# Patient Record
Sex: Female | Born: 1955 | Race: White | Hispanic: No | Smoking: Never smoker
Health system: Southern US, Community
[De-identification: ages and names within clinical notes are randomized; demographics above are authoritative.]

## PROBLEM LIST (undated history)

## (undated) DIAGNOSIS — T8859XA Other complications of anesthesia, initial encounter: Secondary | ICD-10-CM

## (undated) DIAGNOSIS — F419 Anxiety disorder, unspecified: Secondary | ICD-10-CM

## (undated) DIAGNOSIS — T4145XA Adverse effect of unspecified anesthetic, initial encounter: Secondary | ICD-10-CM

## (undated) DIAGNOSIS — F32A Depression, unspecified: Secondary | ICD-10-CM

## (undated) DIAGNOSIS — I341 Nonrheumatic mitral (valve) prolapse: Secondary | ICD-10-CM

## (undated) DIAGNOSIS — K759 Inflammatory liver disease, unspecified: Secondary | ICD-10-CM

## (undated) DIAGNOSIS — R112 Nausea with vomiting, unspecified: Secondary | ICD-10-CM

## (undated) DIAGNOSIS — Z9889 Other specified postprocedural states: Secondary | ICD-10-CM

## (undated) DIAGNOSIS — F1120 Opioid dependence, uncomplicated: Secondary | ICD-10-CM

## (undated) DIAGNOSIS — F329 Major depressive disorder, single episode, unspecified: Secondary | ICD-10-CM

## (undated) HISTORY — PX: ABDOMINAL HYSTERECTOMY: SHX81

## (undated) HISTORY — PX: DENTAL SURGERY: SHX609

## (undated) HISTORY — PX: SPLENECTOMY: SUR1306

## (undated) HISTORY — PX: CHOLECYSTECTOMY: SHX55

---

## 1998-08-18 ENCOUNTER — Emergency Department (HOSPITAL_COMMUNITY): Admission: EM | Admit: 1998-08-18 | Discharge: 1998-08-18 | Payer: Self-pay | Admitting: Emergency Medicine

## 1998-08-20 ENCOUNTER — Emergency Department (HOSPITAL_COMMUNITY): Admission: EM | Admit: 1998-08-20 | Discharge: 1998-08-20 | Payer: Self-pay

## 1998-08-27 ENCOUNTER — Emergency Department (HOSPITAL_COMMUNITY): Admission: EM | Admit: 1998-08-27 | Discharge: 1998-08-27 | Payer: Self-pay | Admitting: Emergency Medicine

## 1999-03-29 ENCOUNTER — Emergency Department (HOSPITAL_COMMUNITY): Admission: EM | Admit: 1999-03-29 | Discharge: 1999-03-29 | Payer: Self-pay | Admitting: Emergency Medicine

## 1999-08-31 ENCOUNTER — Emergency Department (HOSPITAL_COMMUNITY): Admission: EM | Admit: 1999-08-31 | Discharge: 1999-08-31 | Payer: Self-pay | Admitting: Emergency Medicine

## 1999-09-08 ENCOUNTER — Emergency Department (HOSPITAL_COMMUNITY): Admission: EM | Admit: 1999-09-08 | Discharge: 1999-09-09 | Payer: Self-pay | Admitting: Emergency Medicine

## 1999-09-14 ENCOUNTER — Emergency Department (HOSPITAL_COMMUNITY): Admission: EM | Admit: 1999-09-14 | Discharge: 1999-09-14 | Payer: Self-pay | Admitting: Emergency Medicine

## 1999-10-24 ENCOUNTER — Emergency Department (HOSPITAL_COMMUNITY): Admission: EM | Admit: 1999-10-24 | Discharge: 1999-10-24 | Payer: Self-pay | Admitting: Emergency Medicine

## 1999-11-02 ENCOUNTER — Emergency Department (HOSPITAL_COMMUNITY): Admission: EM | Admit: 1999-11-02 | Discharge: 1999-11-02 | Payer: Self-pay | Admitting: Podiatry

## 1999-11-02 ENCOUNTER — Encounter: Payer: Self-pay | Admitting: *Deleted

## 1999-11-19 ENCOUNTER — Emergency Department (HOSPITAL_COMMUNITY): Admission: EM | Admit: 1999-11-19 | Discharge: 1999-11-20 | Payer: Self-pay | Admitting: *Deleted

## 1999-12-05 ENCOUNTER — Emergency Department (HOSPITAL_COMMUNITY): Admission: EM | Admit: 1999-12-05 | Discharge: 1999-12-05 | Payer: Self-pay | Admitting: Emergency Medicine

## 2000-05-05 ENCOUNTER — Emergency Department (HOSPITAL_COMMUNITY): Admission: EM | Admit: 2000-05-05 | Discharge: 2000-05-06 | Payer: Self-pay | Admitting: Internal Medicine

## 2000-05-29 ENCOUNTER — Emergency Department (HOSPITAL_COMMUNITY): Admission: EM | Admit: 2000-05-29 | Discharge: 2000-05-29 | Payer: Self-pay

## 2000-06-01 ENCOUNTER — Emergency Department (HOSPITAL_COMMUNITY): Admission: EM | Admit: 2000-06-01 | Discharge: 2000-06-01 | Payer: Self-pay | Admitting: Emergency Medicine

## 2000-08-26 ENCOUNTER — Emergency Department (HOSPITAL_COMMUNITY): Admission: EM | Admit: 2000-08-26 | Discharge: 2000-08-26 | Payer: Self-pay | Admitting: Emergency Medicine

## 2000-09-05 ENCOUNTER — Emergency Department (HOSPITAL_COMMUNITY): Admission: EM | Admit: 2000-09-05 | Discharge: 2000-09-05 | Payer: Self-pay | Admitting: Emergency Medicine

## 2000-10-17 ENCOUNTER — Emergency Department (HOSPITAL_COMMUNITY): Admission: EM | Admit: 2000-10-17 | Discharge: 2000-10-17 | Payer: Self-pay

## 2000-10-25 ENCOUNTER — Emergency Department (HOSPITAL_COMMUNITY): Admission: EM | Admit: 2000-10-25 | Discharge: 2000-10-25 | Payer: Self-pay | Admitting: Emergency Medicine

## 2000-11-03 ENCOUNTER — Emergency Department (HOSPITAL_COMMUNITY): Admission: EM | Admit: 2000-11-03 | Discharge: 2000-11-03 | Payer: Self-pay | Admitting: Emergency Medicine

## 2000-12-01 ENCOUNTER — Emergency Department (HOSPITAL_COMMUNITY): Admission: EM | Admit: 2000-12-01 | Discharge: 2000-12-01 | Payer: Self-pay | Admitting: Emergency Medicine

## 2001-09-08 ENCOUNTER — Emergency Department (HOSPITAL_COMMUNITY): Admission: EM | Admit: 2001-09-08 | Discharge: 2001-09-08 | Payer: Self-pay | Admitting: Emergency Medicine

## 2002-03-02 ENCOUNTER — Emergency Department (HOSPITAL_COMMUNITY): Admission: EM | Admit: 2002-03-02 | Discharge: 2002-03-02 | Payer: Self-pay | Admitting: Emergency Medicine

## 2002-03-03 ENCOUNTER — Encounter: Payer: Self-pay | Admitting: Emergency Medicine

## 2002-03-22 ENCOUNTER — Emergency Department (HOSPITAL_COMMUNITY): Admission: EM | Admit: 2002-03-22 | Discharge: 2002-03-22 | Payer: Self-pay

## 2002-04-19 ENCOUNTER — Emergency Department (HOSPITAL_COMMUNITY): Admission: EM | Admit: 2002-04-19 | Discharge: 2002-04-19 | Payer: Self-pay

## 2005-10-08 ENCOUNTER — Emergency Department (HOSPITAL_COMMUNITY): Admission: EM | Admit: 2005-10-08 | Discharge: 2005-10-08 | Payer: Self-pay | Admitting: Emergency Medicine

## 2007-09-24 ENCOUNTER — Emergency Department (HOSPITAL_COMMUNITY): Admission: EM | Admit: 2007-09-24 | Discharge: 2007-09-25 | Payer: Self-pay | Admitting: Emergency Medicine

## 2007-12-27 ENCOUNTER — Emergency Department (HOSPITAL_COMMUNITY): Admission: EM | Admit: 2007-12-27 | Discharge: 2007-12-27 | Payer: Self-pay | Admitting: Emergency Medicine

## 2008-08-28 ENCOUNTER — Emergency Department (HOSPITAL_COMMUNITY): Admission: EM | Admit: 2008-08-28 | Discharge: 2008-08-28 | Payer: Self-pay | Admitting: Emergency Medicine

## 2008-11-03 ENCOUNTER — Inpatient Hospital Stay (HOSPITAL_COMMUNITY): Admission: EM | Admit: 2008-11-03 | Discharge: 2008-11-04 | Payer: Self-pay | Admitting: Emergency Medicine

## 2008-11-03 ENCOUNTER — Encounter (INDEPENDENT_AMBULATORY_CARE_PROVIDER_SITE_OTHER): Payer: Self-pay | Admitting: Surgery

## 2009-08-21 ENCOUNTER — Emergency Department (HOSPITAL_COMMUNITY): Admission: EM | Admit: 2009-08-21 | Discharge: 2009-08-21 | Payer: Self-pay | Admitting: Emergency Medicine

## 2009-11-03 ENCOUNTER — Emergency Department (HOSPITAL_COMMUNITY): Admission: EM | Admit: 2009-11-03 | Discharge: 2009-11-03 | Payer: Self-pay | Admitting: Emergency Medicine

## 2009-11-25 ENCOUNTER — Emergency Department (HOSPITAL_COMMUNITY): Admission: EM | Admit: 2009-11-25 | Discharge: 2009-11-26 | Payer: Self-pay | Admitting: Emergency Medicine

## 2009-12-05 ENCOUNTER — Emergency Department (HOSPITAL_COMMUNITY): Admission: EM | Admit: 2009-12-05 | Discharge: 2009-12-06 | Payer: Self-pay | Admitting: Emergency Medicine

## 2010-06-14 ENCOUNTER — Emergency Department (HOSPITAL_COMMUNITY): Admission: EM | Admit: 2010-06-14 | Discharge: 2010-06-14 | Payer: Self-pay | Admitting: Emergency Medicine

## 2010-06-18 ENCOUNTER — Emergency Department (HOSPITAL_COMMUNITY): Admission: EM | Admit: 2010-06-18 | Discharge: 2010-06-18 | Payer: Self-pay | Admitting: Emergency Medicine

## 2010-06-26 ENCOUNTER — Emergency Department (HOSPITAL_COMMUNITY): Admission: EM | Admit: 2010-06-26 | Discharge: 2010-06-26 | Payer: Self-pay | Admitting: Emergency Medicine

## 2010-08-05 ENCOUNTER — Emergency Department (HOSPITAL_COMMUNITY): Admission: EM | Admit: 2010-08-05 | Discharge: 2010-08-05 | Payer: Self-pay | Admitting: Emergency Medicine

## 2010-08-22 ENCOUNTER — Emergency Department (HOSPITAL_COMMUNITY): Admission: EM | Admit: 2010-08-22 | Discharge: 2010-08-23 | Payer: Self-pay | Admitting: Emergency Medicine

## 2010-08-23 ENCOUNTER — Emergency Department (HOSPITAL_COMMUNITY): Admission: EM | Admit: 2010-08-23 | Discharge: 2010-08-23 | Payer: Self-pay | Admitting: Emergency Medicine

## 2010-09-01 ENCOUNTER — Inpatient Hospital Stay (HOSPITAL_COMMUNITY): Admission: AD | Admit: 2010-09-01 | Discharge: 2010-09-08 | Payer: Self-pay | Admitting: Psychiatry

## 2010-09-01 ENCOUNTER — Ambulatory Visit: Payer: Self-pay | Admitting: Psychiatry

## 2010-09-01 ENCOUNTER — Emergency Department (HOSPITAL_COMMUNITY): Admission: EM | Admit: 2010-09-01 | Discharge: 2010-09-01 | Payer: Self-pay | Admitting: Emergency Medicine

## 2010-09-03 ENCOUNTER — Emergency Department (HOSPITAL_COMMUNITY): Admission: EM | Admit: 2010-09-03 | Discharge: 2010-09-04 | Payer: Self-pay | Admitting: Emergency Medicine

## 2010-09-13 ENCOUNTER — Emergency Department (HOSPITAL_COMMUNITY): Admission: EM | Admit: 2010-09-13 | Discharge: 2010-09-13 | Payer: Self-pay | Admitting: Emergency Medicine

## 2010-10-28 ENCOUNTER — Inpatient Hospital Stay (HOSPITAL_COMMUNITY): Admission: EM | Admit: 2010-10-28 | Discharge: 2010-11-04 | Payer: Self-pay | Admitting: Emergency Medicine

## 2010-10-30 DIAGNOSIS — IMO0002 Reserved for concepts with insufficient information to code with codable children: Secondary | ICD-10-CM

## 2010-11-13 ENCOUNTER — Emergency Department (HOSPITAL_COMMUNITY)
Admission: EM | Admit: 2010-11-13 | Discharge: 2010-11-13 | Payer: Self-pay | Source: Home / Self Care | Admitting: Emergency Medicine

## 2010-11-14 ENCOUNTER — Emergency Department (HOSPITAL_COMMUNITY)
Admission: EM | Admit: 2010-11-14 | Discharge: 2010-11-14 | Payer: Self-pay | Source: Home / Self Care | Admitting: Emergency Medicine

## 2010-11-14 ENCOUNTER — Emergency Department (HOSPITAL_COMMUNITY)
Admission: EM | Admit: 2010-11-14 | Discharge: 2010-11-17 | Disposition: A | Discharge status: Other Acute Inpatient Hospital | Payer: Self-pay | Source: Home / Self Care | Admitting: Emergency Medicine

## 2010-11-17 ENCOUNTER — Inpatient Hospital Stay (HOSPITAL_COMMUNITY)
Admission: AD | Admit: 2010-11-17 | Discharge: 2010-12-04 | Disposition: A | Discharge status: Other Acute Inpatient Hospital | Payer: Self-pay | Source: Home / Self Care | Attending: Psychiatry | Admitting: Psychiatry

## 2010-11-25 ENCOUNTER — Emergency Department (HOSPITAL_COMMUNITY)
Admission: EM | Admit: 2010-11-25 | Discharge: 2010-11-25 | Disposition: A | Discharge status: Other Acute Inpatient Hospital | Payer: Self-pay | Source: Home / Self Care | Admitting: Emergency Medicine
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
## 2011-02-16 LAB — COMPREHENSIVE METABOLIC PANEL
ALT: 49 U/L — ABNORMAL HIGH (ref 0–35)
AST: 43 U/L — ABNORMAL HIGH (ref 0–37)
Calcium: 9.5 mg/dL (ref 8.4–10.5)
Creatinine, Ser: 0.66 mg/dL (ref 0.4–1.2)
GFR calc Af Amer: >60 mL/min (ref >60)
GFR calc non Af Amer: >60 mL/min (ref >60)
Sodium: 137 mEq/L (ref 135–145)
Total Protein: 8.1 g/dL (ref 6.0–8.3)

## 2011-02-16 LAB — DIFFERENTIAL
Eosinophils Absolute: 0.1 10*3/uL (ref 0.0–0.7)
Eosinophils Relative: 1 % (ref 0–5)
Lymphocytes Relative: 12 % (ref 12–46)
Lymphs Abs: 1.8 10*3/uL (ref 0.7–4.0)
Monocytes Relative: 15 % — ABNORMAL HIGH (ref 3–12)

## 2011-02-16 LAB — URINALYSIS, ROUTINE W REFLEX MICROSCOPIC
Glucose, UA: NEGATIVE mg/dL
Hgb urine dipstick: NEGATIVE
Protein, ur: NEGATIVE mg/dL
pH: 7 (ref 5.0–8.0)

## 2011-02-16 LAB — GLUCOSE, CAPILLARY: Glucose-Capillary: 101 mg/dL — ABNORMAL HIGH (ref 70–99)

## 2011-02-16 LAB — URINE MICROSCOPIC-ADD ON

## 2011-02-16 LAB — CBC
Hemoglobin: 12.7 g/dL (ref 12.0–15.0)
MCH: 32.4 pg (ref 26.0–34.0)
MCHC: 33.2 g/dL (ref 30.0–36.0)
Platelets: 404 10*3/uL — ABNORMAL HIGH (ref 150–400)
RDW: 14.8 % (ref 11.5–15.5)

## 2011-02-16 LAB — LIPASE, BLOOD: Lipase: 59 U/L (ref 11–59)

## 2011-02-17 LAB — COMPREHENSIVE METABOLIC PANEL
ALT: 163 U/L — ABNORMAL HIGH (ref 0–35)
ALT: 203 U/L — ABNORMAL HIGH (ref 0–35)
ALT: 226 U/L — ABNORMAL HIGH (ref 0–35)
ALT: 349 U/L — ABNORMAL HIGH (ref 0–35)
AST: 200 U/L — ABNORMAL HIGH (ref 0–37)
AST: 287 U/L — ABNORMAL HIGH (ref 0–37)
AST: 87 U/L — ABNORMAL HIGH (ref 0–37)
Albumin: 3.4 g/dL — ABNORMAL LOW (ref 3.5–5.2)
Albumin: 2.6 g/dL — ABNORMAL LOW (ref 3.5–5.2)
Albumin: 2.8 g/dL — ABNORMAL LOW (ref 3.5–5.2)
Albumin: 3.1 g/dL — ABNORMAL LOW (ref 3.5–5.2)
Alkaline Phosphatase: 150 U/L — ABNORMAL HIGH (ref 39–117)
BUN: 9 mg/dL (ref 6–23)
BUN: 3 mg/dL — ABNORMAL LOW (ref 6–23)
BUN: 8 mg/dL (ref 6–23)
CO2: 28 mEq/L (ref 19–32)
CO2: 29 mEq/L (ref 19–32)
Calcium: 8.5 mg/dL (ref 8.4–10.5)
Calcium: 8.5 mg/dL (ref 8.4–10.5)
Calcium: 8.6 mg/dL (ref 8.4–10.5)
Calcium: 8.7 mg/dL (ref 8.4–10.5)
Calcium: 8.8 mg/dL (ref 8.4–10.5)
Chloride: 97 mEq/L (ref 96–112)
Chloride: 102 mEq/L (ref 96–112)
Chloride: 104 mEq/L (ref 96–112)
Creatinine, Ser: 0.89 mg/dL (ref 0.4–1.2)
Creatinine, Ser: 0.61 mg/dL (ref 0.4–1.2)
Creatinine, Ser: 0.68 mg/dL (ref 0.4–1.2)
Creatinine, Ser: 0.76 mg/dL (ref 0.4–1.2)
GFR calc Af Amer: >60 mL/min (ref >60)
GFR calc Af Amer: >60 mL/min (ref >60)
GFR calc Af Amer: >60 mL/min (ref >60)
GFR calc non Af Amer: >60 mL/min (ref >60)
GFR calc non Af Amer: >60 mL/min (ref >60)
Glucose, Bld: 106 mg/dL — ABNORMAL HIGH (ref 70–99)
Glucose, Bld: 93 mg/dL (ref 70–99)
Potassium: 3.8 mEq/L (ref 3.5–5.1)
Sodium: 138 mEq/L (ref 135–145)
Sodium: 138 mEq/L (ref 135–145)
Sodium: 140 mEq/L (ref 135–145)
Sodium: 141 mEq/L (ref 135–145)
Total Bilirubin: 0.6 mg/dL (ref 0.3–1.2)
Total Bilirubin: 1.2 mg/dL (ref 0.3–1.2)
Total Protein: 7.9 g/dL (ref 6.0–8.3)
Total Protein: 6 g/dL (ref 6.0–8.3)
Total Protein: 7.5 g/dL (ref 6.0–8.3)

## 2011-02-17 LAB — DIFFERENTIAL
Basophils Absolute: 0.2 10*3/uL — ABNORMAL HIGH (ref 0.0–0.1)
Basophils Relative: 2 % — ABNORMAL HIGH (ref 0–1)
Basophils Relative: 2 % — ABNORMAL HIGH (ref 0–1)
Eosinophils Absolute: 0 10*3/uL (ref 0.0–0.7)
Eosinophils Relative: 0 % (ref 0–5)
Lymphs Abs: 3.7 10*3/uL (ref 0.7–4.0)
Monocytes Absolute: 0.4 10*3/uL (ref 0.1–1.0)
Monocytes Absolute: 0.7 10*3/uL (ref 0.1–1.0)
Monocytes Relative: 7 % (ref 3–12)
Neutro Abs: 4.7 10*3/uL (ref 1.7–7.7)

## 2011-02-17 LAB — ETHANOL
Alcohol, Ethyl (B): 205 mg/dL — ABNORMAL HIGH (ref 0–10)
Alcohol, Ethyl (B): 271 mg/dL — ABNORMAL HIGH (ref 0–10)
Alcohol, Ethyl (B): 297 mg/dL — ABNORMAL HIGH (ref 0–10)

## 2011-02-17 LAB — RAPID URINE DRUG SCREEN, HOSP PERFORMED
Amphetamines: NOT DETECTED
Amphetamines: NOT DETECTED
Barbiturates: NOT DETECTED
Benzodiazepines: POSITIVE — AB
Benzodiazepines: POSITIVE — AB
Benzodiazepines: POSITIVE — AB
Benzodiazepines: POSITIVE — AB
Cocaine: NOT DETECTED
Cocaine: NOT DETECTED
Cocaine: NOT DETECTED
Opiates: NOT DETECTED
Tetrahydrocannabinol: NOT DETECTED
Tetrahydrocannabinol: NOT DETECTED

## 2011-02-17 LAB — HEPATIC FUNCTION PANEL
ALT: 100 U/L — ABNORMAL HIGH (ref 0–35)
AST: 99 U/L — ABNORMAL HIGH (ref 0–37)
Albumin: 2.9 g/dL — ABNORMAL LOW (ref 3.5–5.2)
Alkaline Phosphatase: 122 U/L — ABNORMAL HIGH (ref 39–117)
Total Bilirubin: 1.1 mg/dL (ref 0.3–1.2)
Total Protein: 7 g/dL (ref 6.0–8.3)

## 2011-02-17 LAB — POCT I-STAT, CHEM 8
BUN: 11 mg/dL (ref 6–23)
Calcium, Ion: 0.91 mmol/L — ABNORMAL LOW (ref 1.12–1.32)
Chloride: 102 mEq/L (ref 96–112)
Creatinine, Ser: 1.2 mg/dL (ref 0.4–1.2)
Glucose, Bld: 112 mg/dL — ABNORMAL HIGH (ref 70–99)
HCT: 39 % (ref 36.0–46.0)
Potassium: 3.5 mEq/L (ref 3.5–5.1)

## 2011-02-17 LAB — CBC
HCT: 36.3 % (ref 36.0–46.0)
HCT: 37.5 % (ref 36.0–46.0)
HCT: 40.4 % (ref 36.0–46.0)
Hemoglobin: 12.3 g/dL (ref 12.0–15.0)
Hemoglobin: 12.7 g/dL (ref 12.0–15.0)
MCH: 32.8 pg (ref 26.0–34.0)
MCH: 32 pg (ref 26.0–34.0)
MCH: 33.1 pg (ref 26.0–34.0)
MCHC: 32 g/dL (ref 30.0–36.0)
MCHC: 33.9 g/dL (ref 30.0–36.0)
MCHC: 33.9 g/dL (ref 30.0–36.0)
MCHC: 34.1 g/dL (ref 30.0–36.0)
MCHC: 34.2 g/dL (ref 30.0–36.0)
MCV: 94.6 fL (ref 78.0–100.0)
MCV: 96 fL (ref 78.0–100.0)
MCV: 97.7 fL (ref 78.0–100.0)
Platelets: 207 10*3/uL (ref 150–400)
Platelets: 445 10*3/uL — ABNORMAL HIGH (ref 150–400)
Platelets: 548 10*3/uL — ABNORMAL HIGH (ref 150–400)
RBC: 3.8 MIL/uL — ABNORMAL LOW (ref 3.87–5.11)
RDW: 14.4 % (ref 11.5–15.5)
RDW: 14.5 % (ref 11.5–15.5)
RDW: 15.2 % (ref 11.5–15.5)

## 2011-02-17 LAB — BASIC METABOLIC PANEL
BUN: 10 mg/dL (ref 6–23)
CO2: 26 mEq/L (ref 19–32)
Calcium: 8.8 mg/dL (ref 8.4–10.5)
Chloride: 100 mEq/L (ref 96–112)
Chloride: 104 mEq/L (ref 96–112)
Creatinine, Ser: 0.76 mg/dL (ref 0.4–1.2)
GFR calc Af Amer: >60 mL/min (ref >60)
Glucose, Bld: 113 mg/dL — ABNORMAL HIGH (ref 70–99)
Glucose, Bld: 115 mg/dL — ABNORMAL HIGH (ref 70–99)
Potassium: 3.7 mEq/L (ref 3.5–5.1)
Potassium: 3.8 mEq/L (ref 3.5–5.1)
Sodium: 137 mEq/L (ref 135–145)

## 2011-02-17 LAB — HIV ANTIBODY (ROUTINE TESTING W REFLEX): HIV: NONREACTIVE

## 2011-02-17 LAB — PROTIME-INR
INR: 1.05 (ref 0.00–1.49)
Prothrombin Time: 13.9 seconds (ref 11.6–15.2)

## 2011-02-17 LAB — HEPATITIS PANEL, ACUTE
HCV Ab: REACTIVE — AB
Hep A IgM: NEGATIVE
Hep B C IgM: NEGATIVE

## 2011-02-17 LAB — TRICYCLICS SCREEN, URINE: TCA Scrn: NOT DETECTED

## 2011-02-17 LAB — HEPATITIS B CORE ANTIBODY, TOTAL: Hep B Core Total Ab: NEGATIVE

## 2011-02-19 LAB — RAPID URINE DRUG SCREEN, HOSP PERFORMED
Amphetamines: NOT DETECTED
Amphetamines: NOT DETECTED
Amphetamines: NOT DETECTED
Barbiturates: NOT DETECTED
Barbiturates: NOT DETECTED
Benzodiazepines: POSITIVE — AB
Benzodiazepines: POSITIVE — AB
Cocaine: NOT DETECTED
Cocaine: NOT DETECTED
Cocaine: NOT DETECTED
Opiates: NOT DETECTED
Opiates: NOT DETECTED
Opiates: POSITIVE — AB
Tetrahydrocannabinol: NOT DETECTED

## 2011-02-19 LAB — DIFFERENTIAL
Basophils Absolute: 0.1 10*3/uL (ref 0.0–0.1)
Basophils Absolute: 0.1 10*3/uL (ref 0.0–0.1)
Basophils Absolute: 0.2 10*3/uL — ABNORMAL HIGH (ref 0.0–0.1)
Basophils Relative: 1 % (ref 0–1)
Basophils Relative: 1 % (ref 0–1)
Eosinophils Absolute: 0 10*3/uL (ref 0.0–0.7)
Eosinophils Relative: 0 % (ref 0–5)
Eosinophils Relative: 0 % (ref 0–5)
Eosinophils Relative: 0 % (ref 0–5)
Lymphocytes Relative: 28 % (ref 12–46)
Lymphocytes Relative: 49 % — ABNORMAL HIGH (ref 12–46)
Lymphs Abs: 2.2 10*3/uL (ref 0.7–4.0)
Monocytes Absolute: 1.3 10*3/uL — ABNORMAL HIGH (ref 0.1–1.0)
Monocytes Relative: 14 % — ABNORMAL HIGH (ref 3–12)
Monocytes Relative: 15 % — ABNORMAL HIGH (ref 3–12)
Monocytes Relative: 21 % — ABNORMAL HIGH (ref 3–12)
Neutro Abs: 2.9 10*3/uL (ref 1.7–7.7)
Neutrophils Relative %: 35 % — ABNORMAL LOW (ref 43–77)
Neutrophils Relative %: 62 % (ref 43–77)
Neutrophils Relative %: 65 % (ref 43–77)

## 2011-02-19 LAB — COMPREHENSIVE METABOLIC PANEL
ALT: 32 U/L (ref 0–35)
Albumin: 2.8 g/dL — ABNORMAL LOW (ref 3.5–5.2)
BUN: 9 mg/dL (ref 6–23)
CO2: 24 mEq/L (ref 19–32)
Calcium: 8.4 mg/dL (ref 8.4–10.5)
Chloride: 106 mEq/L (ref 96–112)
Creatinine, Ser: 0.73 mg/dL (ref 0.4–1.2)
GFR calc non Af Amer: >60 mL/min (ref >60)
Glucose, Bld: 105 mg/dL — ABNORMAL HIGH (ref 70–99)
Sodium: 139 mEq/L (ref 135–145)
Total Bilirubin: 0.5 mg/dL (ref 0.3–1.2)
Total Protein: 6.8 g/dL (ref 6.0–8.3)

## 2011-02-19 LAB — CBC
HCT: 27.7 % — ABNORMAL LOW (ref 36.0–46.0)
HCT: 29.7 % — ABNORMAL LOW (ref 36.0–46.0)
HCT: 35 % — ABNORMAL LOW (ref 36.0–46.0)
Hemoglobin: 10 g/dL — ABNORMAL LOW (ref 12.0–15.0)
Hemoglobin: 11.6 g/dL — ABNORMAL LOW (ref 12.0–15.0)
MCH: 32.7 pg (ref 26.0–34.0)
MCHC: 33.7 g/dL (ref 30.0–36.0)
MCHC: 36.7 g/dL — ABNORMAL HIGH (ref 30.0–36.0)
MCHC: 34.4 g/dL (ref 30.0–36.0)
MCV: 100 fL (ref 78.0–100.0)
MCV: 105.4 fL — ABNORMAL HIGH (ref 78.0–100.0)
Platelets: 190 10*3/uL (ref 150–400)
Platelets: 434 10*3/uL — ABNORMAL HIGH (ref 150–400)
RBC: 3.32 MIL/uL — ABNORMAL LOW (ref 3.87–5.11)
RBC: 3.43 MIL/uL — ABNORMAL LOW (ref 3.87–5.11)
RDW: 19.4 % — ABNORMAL HIGH (ref 11.5–15.5)
RDW: 19.7 % — ABNORMAL HIGH (ref 11.5–15.5)
RDW: 19.3 % — ABNORMAL HIGH (ref 11.5–15.5)
WBC: 18.4 10*3/uL — ABNORMAL HIGH (ref 4.0–10.5)
WBC: 5.9 10*3/uL (ref 4.0–10.5)
WBC: 9.9 10*3/uL (ref 4.0–10.5)

## 2011-02-19 LAB — POCT CARDIAC MARKERS: Myoglobin, poc: 88.3 ng/mL (ref 12–200)

## 2011-02-19 LAB — URINALYSIS, ROUTINE W REFLEX MICROSCOPIC
Bilirubin Urine: NEGATIVE
Hgb urine dipstick: NEGATIVE
Ketones, ur: NEGATIVE mg/dL
Specific Gravity, Urine: 1.016 (ref 1.005–1.030)
pH: 7 (ref 5.0–8.0)

## 2011-02-19 LAB — URINE MICROSCOPIC-ADD ON

## 2011-02-19 LAB — POCT I-STAT, CHEM 8
BUN: <3 mg/dL — ABNORMAL LOW (ref 6–23)
Calcium, Ion: 0.86 mmol/L — ABNORMAL LOW (ref 1.12–1.32)
Chloride: 93 mEq/L — ABNORMAL LOW (ref 96–112)
Glucose, Bld: 141 mg/dL — ABNORMAL HIGH (ref 70–99)
HCT: 38 % (ref 36.0–46.0)
Hemoglobin: 12.9 g/dL (ref 12.0–15.0)
Potassium: 3.2 mEq/L — ABNORMAL LOW (ref 3.5–5.1)
Potassium: 3.9 mEq/L (ref 3.5–5.1)
Sodium: 142 mEq/L (ref 135–145)

## 2011-02-19 LAB — VITAMIN B12: Vitamin B-12: 611 pg/mL (ref 211–911)

## 2011-02-19 LAB — URINE CULTURE
Colony Count: NO GROWTH
Culture  Setup Time: 201109290433
Culture: NO GROWTH

## 2011-02-19 LAB — BASIC METABOLIC PANEL
BUN: 3 mg/dL — ABNORMAL LOW (ref 6–23)
BUN: 6 mg/dL (ref 6–23)
CO2: 31 mEq/L (ref 19–32)
Calcium: 8.2 mg/dL — ABNORMAL LOW (ref 8.4–10.5)
Chloride: 103 mEq/L (ref 96–112)
Creatinine, Ser: 0.9 mg/dL (ref 0.4–1.2)
GFR calc Af Amer: >60 mL/min (ref >60)
GFR calc non Af Amer: >60 mL/min (ref >60)
Glucose, Bld: 108 mg/dL — ABNORMAL HIGH (ref 70–99)
Potassium: 2.9 mEq/L — ABNORMAL LOW (ref 3.5–5.1)
Sodium: 142 mEq/L (ref 135–145)

## 2011-02-19 LAB — RETICULOCYTES
RBC.: 3.21 MIL/uL — ABNORMAL LOW (ref 3.87–5.11)
Retic Count, Absolute: 118.8 10*3/uL (ref 19.0–186.0)

## 2011-02-19 LAB — IRON AND TIBC
Iron: 59 ug/dL (ref 42–135)
TIBC: 313 ug/dL (ref 250–470)

## 2011-02-19 LAB — RPR: RPR Ser Ql: NONREACTIVE

## 2011-02-19 LAB — TSH: TSH: 2.887 u[IU]/mL (ref 0.350–4.500)

## 2011-02-19 LAB — ETHANOL: Alcohol, Ethyl (B): 303 mg/dL — ABNORMAL HIGH (ref 0–10)

## 2011-02-19 LAB — FERRITIN: Ferritin: 771 ng/mL — ABNORMAL HIGH (ref 10–291)

## 2011-02-20 LAB — POCT PREGNANCY, URINE: Preg Test, Ur: NEGATIVE

## 2011-02-21 LAB — COMPREHENSIVE METABOLIC PANEL
ALT: 30 U/L (ref 0–35)
AST: 61 U/L — ABNORMAL HIGH (ref 0–37)
Albumin: 3.4 g/dL — ABNORMAL LOW (ref 3.5–5.2)
Alkaline Phosphatase: 92 U/L (ref 39–117)
Calcium: 8.7 mg/dL (ref 8.4–10.5)
GFR calc Af Amer: >60 mL/min (ref >60)
Potassium: 3.5 mEq/L (ref 3.5–5.1)
Sodium: 138 mEq/L (ref 135–145)
Total Protein: 7 g/dL (ref 6.0–8.3)

## 2011-02-21 LAB — CBC
HCT: 41.7 % (ref 36.0–46.0)
MCHC: 33.4 g/dL (ref 30.0–36.0)
Platelets: 387 10*3/uL (ref 150–400)
RDW: 15.8 % — ABNORMAL HIGH (ref 11.5–15.5)
WBC: 7.9 10*3/uL (ref 4.0–10.5)

## 2011-02-21 LAB — DIFFERENTIAL
Basophils Relative: 2 % — ABNORMAL HIGH (ref 0–1)
Eosinophils Absolute: 0 10*3/uL (ref 0.0–0.7)
Lymphs Abs: 1.9 10*3/uL (ref 0.7–4.0)
Monocytes Absolute: 1.1 10*3/uL — ABNORMAL HIGH (ref 0.1–1.0)
Monocytes Relative: 14 % — ABNORMAL HIGH (ref 3–12)

## 2011-02-22 LAB — DIFFERENTIAL
Basophils Absolute: 0.1 10*3/uL (ref 0.0–0.1)
Eosinophils Relative: 0 % (ref 0–5)
Lymphocytes Relative: 23 % (ref 12–46)
Lymphs Abs: 2.9 10*3/uL (ref 0.7–4.0)
Monocytes Absolute: 0.9 10*3/uL (ref 0.1–1.0)
Monocytes Relative: 7 % (ref 3–12)
Neutro Abs: 9.1 10*3/uL — ABNORMAL HIGH (ref 1.7–7.7)

## 2011-02-22 LAB — URINALYSIS, ROUTINE W REFLEX MICROSCOPIC
Bilirubin Urine: NEGATIVE
Nitrite: NEGATIVE
Protein, ur: NEGATIVE mg/dL
Specific Gravity, Urine: 1.008 (ref 1.005–1.030)
Urobilinogen, UA: 0.2 mg/dL (ref 0.0–1.0)

## 2011-02-22 LAB — BASIC METABOLIC PANEL
Chloride: 105 mEq/L (ref 96–112)
GFR calc non Af Amer: >60 mL/min (ref >60)
Potassium: 3.3 mEq/L — ABNORMAL LOW (ref 3.5–5.1)
Sodium: 143 mEq/L (ref 135–145)

## 2011-02-22 LAB — RAPID URINE DRUG SCREEN, HOSP PERFORMED
Amphetamines: NOT DETECTED
Barbiturates: NOT DETECTED
Benzodiazepines: NOT DETECTED
Opiates: NOT DETECTED
Tetrahydrocannabinol: NOT DETECTED

## 2011-02-22 LAB — ETHANOL: Alcohol, Ethyl (B): 132 mg/dL — ABNORMAL HIGH (ref 0–10)

## 2011-02-22 LAB — POCT PREGNANCY, URINE: Preg Test, Ur: NEGATIVE

## 2011-02-22 LAB — URINE MICROSCOPIC-ADD ON

## 2011-02-22 LAB — CBC
HCT: 35.8 % — ABNORMAL LOW (ref 36.0–46.0)
Hemoglobin: 12.3 g/dL (ref 12.0–15.0)
MCV: 88.5 fL (ref 78.0–100.0)
RBC: 4.05 MIL/uL (ref 3.87–5.11)
WBC: 13.1 10*3/uL — ABNORMAL HIGH (ref 4.0–10.5)

## 2011-04-06 ENCOUNTER — Emergency Department (HOSPITAL_COMMUNITY)
Admission: EM | Admit: 2011-04-06 | Discharge: 2011-04-06 | Disposition: A | Discharge status: Home / Self Care | Payer: Self-pay | Attending: Emergency Medicine | Admitting: Emergency Medicine

## 2011-04-06 DIAGNOSIS — F329 Major depressive disorder, single episode, unspecified: Secondary | ICD-10-CM | POA: Insufficient documentation

## 2011-04-06 DIAGNOSIS — F3289 Other specified depressive episodes: Secondary | ICD-10-CM | POA: Insufficient documentation

## 2011-04-06 DIAGNOSIS — IMO0002 Reserved for concepts with insufficient information to code with codable children: Secondary | ICD-10-CM | POA: Insufficient documentation

## 2011-04-10 ENCOUNTER — Ambulatory Visit (HOSPITAL_COMMUNITY)
Admission: RE | Admit: 2011-04-10 | Discharge: 2011-04-10 | Disposition: A | Discharge status: Home / Self Care | Payer: Self-pay | Attending: Psychiatry | Admitting: Psychiatry

## 2011-04-10 ENCOUNTER — Emergency Department (HOSPITAL_COMMUNITY)
Admission: EM | Admit: 2011-04-10 | Discharge: 2011-04-10 | Discharge status: Against Medical Advice | Payer: Self-pay | Attending: Emergency Medicine | Admitting: Emergency Medicine

## 2011-04-10 DIAGNOSIS — F1020 Alcohol dependence, uncomplicated: Secondary | ICD-10-CM | POA: Insufficient documentation

## 2011-04-11 ENCOUNTER — Emergency Department (HOSPITAL_COMMUNITY)
Admission: EM | Admit: 2011-04-11 | Discharge: 2011-04-11 | Disposition: A | Discharge status: Home / Self Care | Payer: Self-pay | Source: Ambulatory Visit | Attending: Emergency Medicine | Admitting: Emergency Medicine

## 2011-04-11 DIAGNOSIS — F1020 Alcohol dependence, uncomplicated: Secondary | ICD-10-CM | POA: Insufficient documentation

## 2011-04-11 DIAGNOSIS — F411 Generalized anxiety disorder: Secondary | ICD-10-CM | POA: Insufficient documentation

## 2011-04-11 LAB — BASIC METABOLIC PANEL
BUN: 7 mg/dL (ref 6–23)
CO2: 30 mEq/L (ref 19–32)
Calcium: 8.8 mg/dL (ref 8.4–10.5)
Glucose, Bld: 96 mg/dL (ref 70–99)
Potassium: 3.6 mEq/L (ref 3.5–5.1)
Sodium: 145 mEq/L (ref 135–145)

## 2011-04-11 LAB — CBC
HCT: 39.2 % (ref 36.0–46.0)
MCH: 30 pg (ref 26.0–34.0)
MCV: 86.5 fL (ref 78.0–100.0)
RDW: 13.1 % (ref 11.5–15.5)
WBC: 9.3 10*3/uL (ref 4.0–10.5)

## 2011-04-11 LAB — DIFFERENTIAL
Eosinophils Relative: 1 % (ref 0–5)
Lymphocytes Relative: 58 % — ABNORMAL HIGH (ref 12–46)
Lymphs Abs: 5.4 10*3/uL — ABNORMAL HIGH (ref 0.7–4.0)
Monocytes Absolute: 1.2 10*3/uL — ABNORMAL HIGH (ref 0.1–1.0)
Monocytes Relative: 13 % — ABNORMAL HIGH (ref 3–12)

## 2011-04-11 LAB — HEPATIC FUNCTION PANEL
ALT: 34 U/L (ref 0–35)
AST: 51 U/L — ABNORMAL HIGH (ref 0–37)
Bilirubin, Direct: <0.1 mg/dL (ref 0.0–0.3)
Total Bilirubin: 0.1 mg/dL — ABNORMAL LOW (ref 0.3–1.2)

## 2011-04-12 ENCOUNTER — Emergency Department (HOSPITAL_COMMUNITY)
Admission: EM | Admit: 2011-04-12 | Discharge: 2011-04-13 | Disposition: A | Discharge status: Home / Self Care | Payer: Self-pay | Attending: Emergency Medicine | Admitting: Emergency Medicine

## 2011-04-12 DIAGNOSIS — F1010 Alcohol abuse, uncomplicated: Secondary | ICD-10-CM | POA: Insufficient documentation

## 2011-04-13 DIAGNOSIS — F1020 Alcohol dependence, uncomplicated: Secondary | ICD-10-CM

## 2011-04-13 LAB — COMPREHENSIVE METABOLIC PANEL WITH GFR
ALT: 26 U/L (ref 0–35)
AST: 36 U/L (ref 0–37)
Albumin: 3 g/dL — ABNORMAL LOW (ref 3.5–5.2)
Alkaline Phosphatase: 66 U/L (ref 39–117)
BUN: 6 mg/dL (ref 6–23)
CO2: 27 meq/L (ref 19–32)
Calcium: 8.7 mg/dL (ref 8.4–10.5)
Chloride: 102 meq/L (ref 96–112)
Creatinine, Ser: 0.51 mg/dL (ref 0.4–1.2)
GFR calc non Af Amer: >60 mL/min
Glucose, Bld: 96 mg/dL (ref 70–99)
Potassium: 3.2 meq/L — ABNORMAL LOW (ref 3.5–5.1)
Sodium: 138 meq/L (ref 135–145)
Total Bilirubin: 0.2 mg/dL — ABNORMAL LOW (ref 0.3–1.2)
Total Protein: 6.1 g/dL (ref 6.0–8.3)

## 2011-04-13 LAB — URINE MICROSCOPIC-ADD ON

## 2011-04-13 LAB — DIFFERENTIAL
Basophils Absolute: 0.1 K/uL (ref 0.0–0.1)
Basophils Relative: 1 % (ref 0–1)
Eosinophils Absolute: 0.3 K/uL (ref 0.0–0.7)
Eosinophils Relative: 3 % (ref 0–5)
Lymphocytes Relative: 42 % (ref 12–46)
Lymphs Abs: 3.7 K/uL (ref 0.7–4.0)
Monocytes Absolute: 0.9 K/uL (ref 0.1–1.0)
Monocytes Relative: 11 % (ref 3–12)
Neutro Abs: 3.8 K/uL (ref 1.7–7.7)
Neutrophils Relative %: 44 % (ref 43–77)

## 2011-04-13 LAB — RAPID URINE DRUG SCREEN, HOSP PERFORMED
Amphetamines: NOT DETECTED
Barbiturates: NOT DETECTED
Benzodiazepines: NOT DETECTED
Cocaine: NOT DETECTED
Opiates: NOT DETECTED
Tetrahydrocannabinol: NOT DETECTED

## 2011-04-13 LAB — CBC
HCT: 34.7 % — ABNORMAL LOW (ref 36.0–46.0)
Hemoglobin: 11.8 g/dL — ABNORMAL LOW (ref 12.0–15.0)
MCH: 30 pg (ref 26.0–34.0)
MCHC: 34 g/dL (ref 30.0–36.0)
MCV: 88.3 fL (ref 78.0–100.0)
Platelets: 315 K/uL (ref 150–400)
RBC: 3.93 MIL/uL (ref 3.87–5.11)
RDW: 12.8 % (ref 11.5–15.5)
WBC: 8.8 K/uL (ref 4.0–10.5)

## 2011-04-13 LAB — ETHANOL: Alcohol, Ethyl (B): 70 mg/dL — ABNORMAL HIGH (ref 0–10)

## 2011-04-13 LAB — URINALYSIS, ROUTINE W REFLEX MICROSCOPIC
Glucose, UA: NEGATIVE mg/dL
Protein, ur: NEGATIVE mg/dL
pH: 6 (ref 5.0–8.0)

## 2011-04-16 ENCOUNTER — Emergency Department (HOSPITAL_COMMUNITY)
Admission: EM | Admit: 2011-04-16 | Discharge: 2011-04-17 | Discharge status: Against Medical Advice | Payer: Self-pay | Attending: Emergency Medicine | Admitting: Emergency Medicine

## 2011-04-16 ENCOUNTER — Emergency Department (HOSPITAL_COMMUNITY)
Admission: EM | Admit: 2011-04-16 | Discharge: 2011-04-16 | Discharge status: Against Medical Advice | Payer: Self-pay | Attending: Emergency Medicine | Admitting: Emergency Medicine

## 2011-04-16 DIAGNOSIS — F1010 Alcohol abuse, uncomplicated: Secondary | ICD-10-CM | POA: Insufficient documentation

## 2011-04-21 NOTE — Op Note (Signed)
NAME:  Lauren Lucas, Lauren Lucas            ACCOUNT NO.:  [id number]   MEDICAL RECORD NO.:  [id number]          PATIENT TYPE:  INP   LOCATION:  5120                         FACILITY:  [facility]   PHYSICIAN:  Sandria Bales. Ezzard Standing, M.D.  DATE OF BIRTH:  01/08/1956   DATE OF PROCEDURE:  11/03/2008  DATE OF DISCHARGE:                               OPERATIVE REPORT   Date of operation ??   PREOPERATIVE DIAGNOSES:  1. Acute cholecystitis.  2. Cholelithiasis.   POSTOPERATIVE DIAGNOSES:  1. Acute cholecystitis.  2. Cholelithiasis.  3. Common bile duct stone.  4. Adhesions from prior abdominal surgery.   PROCEDURES:  1. Laparoscopic cholecystectomy with intraoperative cholangiogram.  2. Common bile duct exploration with #4 biliary Fogarty.  3. Enterolysis of adhesions, spent 15 minutes.   SURGEON:  Sandria Bales. Ezzard Standing, MD   FIRST ASSISTANT:  Gabrielle Dare. Janee Morn, MD   ANESTHESIA:  General endotracheal.   ESTIMATED BLOOD LOSS:  Minimal.   INDICATIONS FOR PROCEDURE:  Lauren Lucas is a 55 year old white female who  has presented with acute cholecystitis.  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  I have discussed with her about proceeding  with cholecystectomy.   I have discussed the indications and potential complications of gall  bladder surgery.  Potential complications include, but not limited to,  bleeding, infection, bowel injury, open surgery, and common bile duct  injury.   OPERATIVE NOTE:  The patient was placed in a supine position, given a  general endotracheal anesthetic.  Her abdomen was prepped with Betadine  solution and sterilely draped.  She was already on Unasyn as an  antibiotic.  PAS stockings were in place.  A time-out was held  identifying the patient and procedure.   I accessed the abdominal cavity through an infraumbilical incision and  placed a 12-mm Hasson trocar and secured this with a 0 Vicryl suture.  She had a loop of small bowel immediately above into  the right of the  Hasson incision, but I got into the abdominal cavity without difficulty  and insufflated the abdomen.  I placed 2 lateral 5-mm ports.  First, Dr.  Janee Morn spent about 15 minutes taking down some adhesions in the  midline.  I then put a 10 mm in the subxiphoid location.   We then took adhesions down off the gallbladder wall.  She did have an  edematous gallbladder wall, cut down to the cystic duct gallbladder  junction, and placed a clip on the gallbladder side of the cystic duct.   I then shot an intraoperative cholangiogram.  Intraoperative  cholangiogram shot with a cut-off Taut catheter, inserted through a 14-  gauge Jelco, and this was inserted into the side of the cut cystic duct.  The Taut catheter was secured with an EndoClip and using a half-strength  Hypaque solution, I used about 18 cc,but it showed no contrast going  into the duodenum.  There was some meniscal sign at the distal common  bile duct, but the remainder of the common bile duct was unremarkable.   The intraoperative cholangiogram was felt to represent approximately  3-  mm impacted distal common bile duct stone.  I then used a #4 biliary  Fogarty and passed this Fogarty catheter down.  I felt a pop sensation  and passed this into the duodenum.  I passed the biliary Fogarty 3  separate times, dilating at each time, trying as best as I could to open  up the distal common bile duct.  I then shot another cholangiogram,  placed a Taut catheter back in the cystic duct, secured with an  EndoClip, shot with another 8 cc. This time, contrast went down the  common bile duct into the duodenum.  There was an almost no bile reflux  back up in the hepatic radicles, and at this time, this was felt to be a  normal intraoperative cholangiogram with no residual filling defects.   At this time, I think that I cleared the common bile duct.  I think  there is a risk of having a retained gallstone, which we would  inform  her of such.   I then removed the Taut catheter.  I triply endoclipped the cystic duct.  I found the cystic artery, an anterior and posterior branch and doubly  clipped this 2 times.  Actually, the cystic artery got away from me one  time, but I did put 2 good clips on it.   The gallbladder sharply and bluntly dissected from the gallbladder bed.  Again, there was an edematous gallbladder wall consistent with acute  cholecystitis.  I placed the gallbladder in an EndoCatch bag and  delivered through the umbilicus.  I irrigated the abdomen with 2 L of  saline.  I closed the umbilical port with 0 Vicryl suture.  I then  removed each port in turn.  I closed the skin with a 5-0 Vicryl suture,  painted the wounds with tincture of benzoin and Steri-Strip'ed them.   The patient tolerated the procedure well and was transported to recovery  room in good condition.  Sponge and needle count were correct at the end  of the case.      Sandria Bales. Ezzard Standing, M.D.  Electronically Signed     DHN/MEDQ  D:  11/03/2008  T:  11/04/2008  Job:  [id number]
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
## 2011-05-03 ENCOUNTER — Emergency Department (HOSPITAL_COMMUNITY)
Admission: EM | Admit: 2011-05-03 | Discharge: 2011-05-03 | Discharge status: Against Medical Advice | Payer: Self-pay | Attending: Emergency Medicine | Admitting: Emergency Medicine

## 2011-05-03 DIAGNOSIS — F3289 Other specified depressive episodes: Secondary | ICD-10-CM | POA: Insufficient documentation

## 2011-05-03 DIAGNOSIS — F329 Major depressive disorder, single episode, unspecified: Secondary | ICD-10-CM | POA: Insufficient documentation

## 2011-05-03 DIAGNOSIS — F1010 Alcohol abuse, uncomplicated: Secondary | ICD-10-CM | POA: Insufficient documentation

## 2011-05-05 ENCOUNTER — Emergency Department (HOSPITAL_COMMUNITY)
Admission: EM | Admit: 2011-05-05 | Discharge: 2011-05-05 | Disposition: A | Discharge status: Home / Self Care | Payer: Self-pay | Attending: Emergency Medicine | Admitting: Emergency Medicine

## 2011-05-05 ENCOUNTER — Emergency Department (HOSPITAL_COMMUNITY)
Admission: EM | Admit: 2011-05-05 | Discharge: 2011-05-06 | Disposition: A | Discharge status: Home / Self Care | Payer: Self-pay | Attending: Emergency Medicine | Admitting: Emergency Medicine

## 2011-05-05 DIAGNOSIS — F1010 Alcohol abuse, uncomplicated: Secondary | ICD-10-CM | POA: Insufficient documentation

## 2011-05-05 DIAGNOSIS — R7309 Other abnormal glucose: Secondary | ICD-10-CM | POA: Insufficient documentation

## 2011-05-05 DIAGNOSIS — F329 Major depressive disorder, single episode, unspecified: Secondary | ICD-10-CM | POA: Insufficient documentation

## 2011-05-05 DIAGNOSIS — F3289 Other specified depressive episodes: Secondary | ICD-10-CM | POA: Insufficient documentation

## 2011-05-05 DIAGNOSIS — F4320 Adjustment disorder, unspecified: Secondary | ICD-10-CM

## 2011-05-05 DIAGNOSIS — F411 Generalized anxiety disorder: Secondary | ICD-10-CM | POA: Insufficient documentation

## 2011-05-05 DIAGNOSIS — Z79899 Other long term (current) drug therapy: Secondary | ICD-10-CM | POA: Insufficient documentation

## 2011-05-05 DIAGNOSIS — E876 Hypokalemia: Secondary | ICD-10-CM | POA: Insufficient documentation

## 2011-05-05 LAB — CBC
Hemoglobin: 12.3 g/dL (ref 12.0–15.0)
MCH: 29.4 pg (ref 26.0–34.0)
MCV: 88.5 fL (ref 78.0–100.0)
RBC: 4.18 MIL/uL (ref 3.87–5.11)
WBC: 7.1 10*3/uL (ref 4.0–10.5)

## 2011-05-05 LAB — COMPREHENSIVE METABOLIC PANEL
AST: 79 U/L — ABNORMAL HIGH (ref 0–37)
Albumin: 3.4 g/dL — ABNORMAL LOW (ref 3.5–5.2)
Alkaline Phosphatase: 81 U/L (ref 39–117)
BUN: 6 mg/dL (ref 6–23)
CO2: 26 mEq/L (ref 19–32)
Chloride: 102 mEq/L (ref 96–112)
Creatinine, Ser: 0.62 mg/dL (ref 0.4–1.2)
GFR calc Af Amer: >60 mL/min (ref >60)
GFR calc non Af Amer: >60 mL/min (ref >60)
Potassium: 3 mEq/L — ABNORMAL LOW (ref 3.5–5.1)
Total Bilirubin: 0.2 mg/dL — ABNORMAL LOW (ref 0.3–1.2)

## 2011-05-05 LAB — RAPID URINE DRUG SCREEN, HOSP PERFORMED: Tetrahydrocannabinol: NOT DETECTED

## 2011-05-05 LAB — DIFFERENTIAL
Lymphs Abs: 3 10*3/uL (ref 0.7–4.0)
Monocytes Relative: 10 % (ref 3–12)
Neutro Abs: 3.2 10*3/uL (ref 1.7–7.7)
Neutrophils Relative %: 45 % (ref 43–77)

## 2011-05-06 LAB — POCT I-STAT, CHEM 8
BUN: 5 mg/dL — ABNORMAL LOW (ref 6–23)
Calcium, Ion: 1.03 mmol/L — ABNORMAL LOW (ref 1.12–1.32)
Chloride: 105 meq/L (ref 96–112)
Creatinine, Ser: 0.9 mg/dL (ref 0.4–1.2)
Glucose, Bld: 96 mg/dL (ref 70–99)
HCT: 40 % (ref 36.0–46.0)
Hemoglobin: 13.6 g/dL (ref 12.0–15.0)
Potassium: 4 mEq/L (ref 3.5–5.1)
Sodium: 139 meq/L (ref 135–145)
TCO2: 26 mmol/L (ref 0–100)

## 2011-05-06 LAB — ETHANOL: Alcohol, Ethyl (B): 43 mg/dL — ABNORMAL HIGH (ref 0–10)

## 2011-05-09 ENCOUNTER — Emergency Department (HOSPITAL_COMMUNITY)
Admission: EM | Admit: 2011-05-09 | Discharge: 2011-05-11 | Disposition: A | Discharge status: Home / Self Care | Payer: Self-pay | Attending: Emergency Medicine | Admitting: Emergency Medicine

## 2011-05-09 DIAGNOSIS — G35 Multiple sclerosis: Secondary | ICD-10-CM | POA: Insufficient documentation

## 2011-05-09 DIAGNOSIS — F1010 Alcohol abuse, uncomplicated: Secondary | ICD-10-CM | POA: Insufficient documentation

## 2011-05-09 LAB — CBC
HCT: 41.3 % (ref 36.0–46.0)
Hemoglobin: 14.5 g/dL (ref 12.0–15.0)
MCV: 87.9 fL (ref 78.0–100.0)
RBC: 4.7 MIL/uL (ref 3.87–5.11)
RDW: 14.5 % (ref 11.5–15.5)
WBC: 6.3 10*3/uL (ref 4.0–10.5)

## 2011-05-09 LAB — RAPID URINE DRUG SCREEN, HOSP PERFORMED
Amphetamines: NOT DETECTED
Opiates: NOT DETECTED
Tetrahydrocannabinol: NOT DETECTED

## 2011-05-09 LAB — DIFFERENTIAL
Basophils Absolute: 0.1 10*3/uL (ref 0.0–0.1)
Eosinophils Relative: 1 % (ref 0–5)
Lymphocytes Relative: 43 % (ref 12–46)
Lymphs Abs: 2.7 10*3/uL (ref 0.7–4.0)
Neutro Abs: 2.8 10*3/uL (ref 1.7–7.7)
Neutrophils Relative %: 44 % (ref 43–77)

## 2011-05-09 LAB — BASIC METABOLIC PANEL
Calcium: 8.5 mg/dL (ref 8.4–10.5)
GFR calc Af Amer: >60 mL/min (ref >60)
GFR calc non Af Amer: >60 mL/min (ref >60)
Potassium: 3.1 mEq/L — ABNORMAL LOW (ref 3.5–5.1)
Sodium: 142 mEq/L (ref 135–145)

## 2011-05-09 LAB — ETHANOL: Alcohol, Ethyl (B): 387 mg/dL — ABNORMAL HIGH (ref 0–10)

## 2011-05-10 DIAGNOSIS — F4320 Adjustment disorder, unspecified: Secondary | ICD-10-CM

## 2011-05-11 DIAGNOSIS — F4320 Adjustment disorder, unspecified: Secondary | ICD-10-CM

## 2011-05-19 ENCOUNTER — Emergency Department (HOSPITAL_COMMUNITY)
Admission: EM | Admit: 2011-05-19 | Discharge: 2011-05-20 | Disposition: A | Discharge status: Home / Self Care | Payer: Self-pay | Attending: Emergency Medicine | Admitting: Emergency Medicine

## 2011-05-19 DIAGNOSIS — F1010 Alcohol abuse, uncomplicated: Secondary | ICD-10-CM | POA: Insufficient documentation

## 2011-05-19 DIAGNOSIS — G35 Multiple sclerosis: Secondary | ICD-10-CM | POA: Insufficient documentation

## 2011-05-19 LAB — COMPREHENSIVE METABOLIC PANEL
Alkaline Phosphatase: 89 U/L (ref 39–117)
BUN: 7 mg/dL (ref 6–23)
GFR calc Af Amer: >60 mL/min (ref >60)
Glucose, Bld: 96 mg/dL (ref 70–99)
Potassium: 3 mEq/L — ABNORMAL LOW (ref 3.5–5.1)
Total Protein: 7.2 g/dL (ref 6.0–8.3)

## 2011-05-19 LAB — CBC
HCT: 34 % — ABNORMAL LOW (ref 36.0–46.0)
Hemoglobin: 11.8 g/dL — ABNORMAL LOW (ref 12.0–15.0)
MCH: 30.7 pg (ref 26.0–34.0)
MCHC: 34.7 g/dL (ref 30.0–36.0)
MCV: 88.5 fL (ref 78.0–100.0)

## 2011-05-19 LAB — DIFFERENTIAL
Basophils Absolute: 0.1 10*3/uL (ref 0.0–0.1)
Basophils Relative: 1 % (ref 0–1)
Lymphocytes Relative: 33 % (ref 12–46)
Monocytes Relative: 18 % — ABNORMAL HIGH (ref 3–12)
Neutro Abs: 3.3 10*3/uL (ref 1.7–7.7)
Neutrophils Relative %: 47 % (ref 43–77)

## 2011-05-19 LAB — ETHANOL: Alcohol, Ethyl (B): 51 mg/dL — ABNORMAL HIGH (ref 0–10)

## 2011-05-19 LAB — RAPID URINE DRUG SCREEN, HOSP PERFORMED: Benzodiazepines: NOT DETECTED

## 2011-05-30 ENCOUNTER — Emergency Department (HOSPITAL_COMMUNITY)
Admission: EM | Admit: 2011-05-30 | Discharge: 2011-05-31 | Disposition: A | Discharge status: Home / Self Care | Payer: Self-pay | Attending: Emergency Medicine | Admitting: Emergency Medicine

## 2011-05-30 DIAGNOSIS — R404 Transient alteration of awareness: Secondary | ICD-10-CM | POA: Insufficient documentation

## 2011-05-30 DIAGNOSIS — R51 Headache: Secondary | ICD-10-CM | POA: Insufficient documentation

## 2011-05-30 DIAGNOSIS — I059 Rheumatic mitral valve disease, unspecified: Secondary | ICD-10-CM | POA: Insufficient documentation

## 2011-05-30 DIAGNOSIS — H43399 Other vitreous opacities, unspecified eye: Secondary | ICD-10-CM | POA: Insufficient documentation

## 2011-05-31 ENCOUNTER — Emergency Department (HOSPITAL_COMMUNITY): Payer: Self-pay

## 2011-08-12 ENCOUNTER — Emergency Department (HOSPITAL_COMMUNITY)
Admission: EM | Admit: 2011-08-12 | Discharge: 2011-08-12 | Payer: Self-pay | Attending: Emergency Medicine | Admitting: Emergency Medicine

## 2011-08-12 DIAGNOSIS — I059 Rheumatic mitral valve disease, unspecified: Secondary | ICD-10-CM | POA: Insufficient documentation

## 2011-08-12 DIAGNOSIS — F1010 Alcohol abuse, uncomplicated: Secondary | ICD-10-CM | POA: Insufficient documentation

## 2011-08-13 ENCOUNTER — Emergency Department (HOSPITAL_COMMUNITY): Payer: Self-pay

## 2011-08-13 ENCOUNTER — Inpatient Hospital Stay (HOSPITAL_COMMUNITY)
Admission: EM | Admit: 2011-08-13 | Discharge: 2011-08-15 | DRG: 101 | Disposition: A | Discharge status: Home / Self Care | Payer: Self-pay | Attending: Internal Medicine | Admitting: Internal Medicine

## 2011-08-13 DIAGNOSIS — I059 Rheumatic mitral valve disease, unspecified: Secondary | ICD-10-CM | POA: Diagnosis present

## 2011-08-13 DIAGNOSIS — Z5900 Homelessness unspecified: Secondary | ICD-10-CM

## 2011-08-13 DIAGNOSIS — R569 Unspecified convulsions: Principal | ICD-10-CM | POA: Diagnosis present

## 2011-08-13 DIAGNOSIS — F3289 Other specified depressive episodes: Secondary | ICD-10-CM | POA: Diagnosis present

## 2011-08-13 DIAGNOSIS — F329 Major depressive disorder, single episode, unspecified: Secondary | ICD-10-CM | POA: Diagnosis present

## 2011-08-13 DIAGNOSIS — F1020 Alcohol dependence, uncomplicated: Secondary | ICD-10-CM | POA: Diagnosis present

## 2011-08-13 DIAGNOSIS — E876 Hypokalemia: Secondary | ICD-10-CM | POA: Diagnosis present

## 2011-08-13 DIAGNOSIS — F1010 Alcohol abuse, uncomplicated: Secondary | ICD-10-CM | POA: Diagnosis present

## 2011-08-13 LAB — COMPREHENSIVE METABOLIC PANEL
ALT: 29 U/L (ref 0–35)
Albumin: 3.6 g/dL (ref 3.5–5.2)
BUN: 8 mg/dL (ref 6–23)
CO2: 30 mEq/L (ref 19–32)
Chloride: 104 mEq/L (ref 96–112)
Creatinine, Ser: 0.56 mg/dL (ref 0.50–1.10)
GFR calc Af Amer: >60 mL/min (ref >60)
Potassium: 3.1 mEq/L — ABNORMAL LOW (ref 3.5–5.1)

## 2011-08-13 LAB — URINE MICROSCOPIC-ADD ON

## 2011-08-13 LAB — URINALYSIS, ROUTINE W REFLEX MICROSCOPIC
Bilirubin Urine: NEGATIVE
Nitrite: NEGATIVE
Specific Gravity, Urine: 1.004 — ABNORMAL LOW (ref 1.005–1.030)
pH: 7 (ref 5.0–8.0)

## 2011-08-13 LAB — DIFFERENTIAL
Lymphocytes Relative: 31 % (ref 12–46)
Lymphs Abs: 3.1 10*3/uL (ref 0.7–4.0)
Neutro Abs: 5.7 10*3/uL (ref 1.7–7.7)
Neutrophils Relative %: 57 % (ref 43–77)

## 2011-08-13 LAB — CBC
HCT: 41.9 % (ref 36.0–46.0)
MCV: 90.7 fL (ref 78.0–100.0)
RBC: 4.62 MIL/uL (ref 3.87–5.11)
WBC: 10 10*3/uL (ref 4.0–10.5)

## 2011-08-13 LAB — RAPID URINE DRUG SCREEN, HOSP PERFORMED: Barbiturates: NOT DETECTED

## 2011-08-13 LAB — GLUCOSE, CAPILLARY: Glucose-Capillary: 125 mg/dL — ABNORMAL HIGH (ref 70–99)

## 2011-08-14 ENCOUNTER — Inpatient Hospital Stay (HOSPITAL_COMMUNITY): Payer: Self-pay

## 2011-08-14 ENCOUNTER — Inpatient Hospital Stay (HOSPITAL_COMMUNITY)
Admit: 2011-08-14 | Discharge: 2011-08-14 | Disposition: A | Discharge status: Home / Self Care | Payer: Self-pay | Attending: Internal Medicine | Admitting: Internal Medicine

## 2011-08-14 LAB — URINE CULTURE: Culture  Setup Time: 201209070101

## 2011-08-14 NOTE — Procedures (Unsigned)
REFERRING PHYSICIAN:  Calvert Cantor, MD  HISTORY:  A 55 year old woman with depression, history of alcohol abuse and heroin in the past, took a can of beer before appearing to courthouse today and she was found to have a convulse seizure-like movement, was brought to the ER, EEG for evaluation.  MEDICATIONS:  Celexa.  EEG DURATION:  This is 33.5 minutes of EEG recording.  EEG DESCRIPTION:  This is a routine 16 channel adult EEG recording with 1 channel devoted to limited EKG recording.  Activation procedure was performed using the hyperventilation and the studies performed in awake and mild drowsy state.  As the EEG opens up, I noticed that the dominant rhythm consist of 9 Hz frequency with a low amplitude of 16 microvolts at the most.  Throughout the study, most significant finding is the superimposed beta activity with EMG artifact.  I do observe vertex waves and synchronous spindles as the patient got drowsy during the study.  No electrographic seizures or epileptiform discharges were recorded during the study.  Photic stimulation was not performed, however, no buildup of generalized slowing was observed with the hyperventilation likely because of the lack of efforts.  EEG INTERPRETATION:  This is a normal awake and drowsy EEG.  There is no evidence to see electrographic seizures or epileptiform discharges on the study.  Superimposed beta activity and EMG artifact is commonly seen in patients who are anxious or are on sedating medications.          ______________________________ Levie Heritage, MD    ZO:[id number] D:  08/14/2011 13:36:11  T:  08/14/2011 22:59:04  Job #:  [id number]

## 2011-08-15 LAB — BASIC METABOLIC PANEL
BUN: 12 mg/dL (ref 6–23)
CO2: 28 mEq/L (ref 19–32)
Chloride: 103 mEq/L (ref 96–112)
Creatinine, Ser: 0.58 mg/dL (ref 0.50–1.10)

## 2011-08-15 NOTE — Discharge Summary (Addendum)
NAME:  Lauren Lucas, KARTES NO.:  [id number]  MEDICAL RECORD NO.:  [id number]  LOCATION:                                 FACILITY:  PHYSICIAN:  Andreas Blower, MD       DATE OF BIRTH:  10-10-1956  DATE OF ADMISSION:  08/13/2011 DATE OF DISCHARGE:  08/15/2011                              DISCHARGE SUMMARY  PRIMARY CARE PHYSICIAN:  The patient does not have one.  CONSULTATIONS:  Dr. Eulogio Ditch, M.D. and Psychiatry evaluated the patient during the course of hospital stay.  DISCHARGE DIAGNOSES: 1. Seizure, had a thorough workup and no evidence for seizure activity     during the course of hospital stay. 2. Alcohol abuse. 3. Depression. 4. Homeless. 5. Hypokalemia 6. History of heroin use in the past. 7. History of mitral valve prolapse. 8. Partial hysterectomy. 9. History of splenectomy. 10.History of cholecystectomy. 11.Expiratory laparotomy.  DISCHARGE MEDICATIONS: 1. Folic acid 1 mg p.o. q.day. 2. Ativan 1 mg p.o. b.i.d. for 2 days then 1 mg p.o. q.day for 2 days,     and discontinue.  The patient was instructed to take an extra     milligram of Ativan daily as needed for anxiety.  The patient given     total of 10 tablets. 3. Multivitamin 1 capsule p.o. q.day. 4. Thiamine 100 mg p.o. q.day. 5. Celexa 20 mg p.o. daily at bedtime.  BRIEF ADMITTING HISTORY AND PHYSICAL:  Lauren Lucas is a 55 year old Caucasian female with history of depression and history of alcohol use, who was brought in to the ER on August 13, 2011, with complaints of seizure.  The patient admits to drinking 1/2 can of bear and then went to courthouse, and while in the line in the courthouse, had apparently a tonic clonic seizure and was brought into the ER.  RADIOLOGY/IMAGING:  The patient had head CT without contrast on September 6,2012, which was negative.  The patient had an MRI of the brain without contrast which shows no acute or reversible findings.  No lesions seen to  explain seizure.  Scattered punctate white matter foci likely to reflect an early onset of small vessel disease changes.  The patient had an EEG on August 14, 2011 which showed normal awake and drowsy EEG.  No evidence for seizure or epileptiform discharges.  LABORATORY DATA:  CBC shows a white count of 10.0, hemoglobin 14.2, hematocrit 41.9, and platelet count 375.  Electrolytes normal with a BUN of 12 and creatinine 0.582.  Liver function tests normal with AST of 45, albumin is 2.6 which is normal, magnesium is 1.9, and phosphorus is 3.5. Urine drug screen was negative.  Blood alcohol level was 287 on presentation.  UA was negative for nitrates and trace leukocytes.  HOSPITAL COURSE BY PROBLEM: 1. Seizure.  The patient had a thorough workup during the course of     hospital stay.  This is less likely to be alcohol withdrawal     seizure as the patient was intoxicated while having this episode.     Uncertain if her symptoms were from secondary gains as the patient     was in the line for "appointment," while at the  court house.  The     patient reported that her court appointment was rescheduled for     October.  The patient had MRI and an EEG, which did not show any     findings suggestive of seizure.  I discussed with Dr. Hoy Morn with     Neurology.  This is less likely to be an actual seizure as the     patient was intoxicated on presentation, and lso there is no EEG     or MRI findings to suggest seizure. 2. Alcohol abuse.  Dr. Rogers Blocker from Psychiatry was consulted.     Reported that the patient could be continued on Ativan protocol     which will be continued at the time of discharge.  The patient was     instructed not to drink alcohol while on Ativan.  The patient was     also offered resources for outpatient alcohol cessation at the time     of discharge.  The patient, at the time of discharge, indicated     that she could stay with a friend.  The patient was also  provided     bus pass at the time of discharge. 3. Depression.  Continued Celexa.  The patient reported that she has     enough medications and does not need refills. 4. Hypokalemia, resolved with replacement.  Time spent on discharge, talking to the patient, coordinating care was 25 minutes.  Will have a case manager talk to the patient about establishing herself a PCP at the time of discharge.  We will attempt to see if the patient will qualify for [facility], if not we will offer resources for finding a primary care physician as outpatient at the time of discharge.   Andreas Blower, MD   SR/MEDQ  D:  08/15/2011  T:  08/15/2011  Job:  [id number]  Electronically Signed by Wardell Heath Justin Buechner  on 08/24/2011 07:22:31 PM

## 2011-08-17 NOTE — Consult Note (Signed)
  NAMEMANDEEP, FERCH NO.:  [id number]  MEDICAL RECORD NO.:  [id number]  LOCATION:  1506                         FACILITY:  [facility]  PHYSICIAN:  Eulogio Ditch, MD DATE OF BIRTH:  08/23/1956  DATE OF CONSULTATION:  08/14/2011 DATE OF DISCHARGE:                                CONSULTATION   REASON FOR CONSULTATION:  History of alcohol abuse.  HISTORY OF PRESENT ILLNESS:  A 55 year old Caucasian female who has a long history of chronic alcohol dependence along with depression.  The patient has been admitted multiple times at [facility] and has attended different programs outside, but still she relapses on alcohol. The patient is very logical and goal-directed, not hallucinating or delusional, not suicidal or homicidal.  We discussed different options with the patient, me, and clinical social worker regarding her alcohol treatment and what other kind of supportive helps we can get for her in the outpatient setting.  Currently the patient takes Celexa 20 mg p.o. daily, the patient can be continued on that medication.  MEDICAL HISTORY:  History of mitral valve prolapse.  PAST SURGICAL HISTORY:  Partial hysterectomy, splenectomy, cholecystectomy, exploratory laparotomy.  MENTAL STATUS EXAMINATION:  The patient is calm, cooperative during interview.  Fair eye contact.  No abnormal movements noticed.  Speech normal in rate and volume.  Mood; euthymic affect, mood congruent. Thought process logical and goal-directed.  Thought content; not suicidal or homicidal, not delusional, not internally preoccupied. Cognition; alert, awake, oriented x3.  Memory; immediate, recent, remote fair.  Attention and concentration fair.  Abstraction; good insight and judgment intact.  DIAGNOSES:  AXIS I:  Chronic alcohol dependence, depressive disorder, not otherwise specified.  AXIS II:  Deferred.  AXIS III:  See medical notes.  AXIS IV:  Chronic alcohol abuse.  AXIS V:   50.  RECOMMENDATIONS: 1. The patient can be continued on Ativan detox protocol. 2. The patient can be continued on Celexa 20 mg p.o. daily. 3. The patient can follow up in the outpatient setting after she is     medically cleared.  Thanks for involving me in taking care of this patient.     Eulogio Ditch, MD     SA/MEDQ  D:  08/14/2011  T:  08/14/2011  Job:  [id number]  Electronically Signed by Eulogio Ditch  on 08/17/2011 01:06:13 PM

## 2011-08-19 ENCOUNTER — Emergency Department (HOSPITAL_COMMUNITY)
Admission: EM | Admit: 2011-08-19 | Discharge: 2011-08-19 | Disposition: A | Discharge status: Home / Self Care | Payer: Self-pay | Attending: Emergency Medicine | Admitting: Emergency Medicine

## 2011-08-19 DIAGNOSIS — Z5900 Homelessness unspecified: Secondary | ICD-10-CM | POA: Insufficient documentation

## 2011-08-19 DIAGNOSIS — R4789 Other speech disturbances: Secondary | ICD-10-CM | POA: Insufficient documentation

## 2011-08-19 DIAGNOSIS — F1010 Alcohol abuse, uncomplicated: Secondary | ICD-10-CM | POA: Insufficient documentation

## 2011-08-19 DIAGNOSIS — F3289 Other specified depressive episodes: Secondary | ICD-10-CM | POA: Insufficient documentation

## 2011-08-19 DIAGNOSIS — F329 Major depressive disorder, single episode, unspecified: Secondary | ICD-10-CM | POA: Insufficient documentation

## 2011-08-19 LAB — COMPREHENSIVE METABOLIC PANEL
AST: 70 U/L — ABNORMAL HIGH (ref 0–37)
Albumin: 3.2 g/dL — ABNORMAL LOW (ref 3.5–5.2)
Alkaline Phosphatase: 82 U/L (ref 39–117)
Chloride: 101 mEq/L (ref 96–112)
Creatinine, Ser: <0.47 mg/dL — ABNORMAL LOW (ref 0.50–1.10)
Potassium: 3.7 mEq/L (ref 3.5–5.1)
Total Bilirubin: 0.2 mg/dL — ABNORMAL LOW (ref 0.3–1.2)

## 2011-08-19 LAB — RAPID URINE DRUG SCREEN, HOSP PERFORMED
Barbiturates: NOT DETECTED
Benzodiazepines: NOT DETECTED
Cocaine: NOT DETECTED
Tetrahydrocannabinol: NOT DETECTED

## 2011-08-19 LAB — CBC
Platelets: 432 10*3/uL — ABNORMAL HIGH (ref 150–400)
RDW: 13.8 % (ref 11.5–15.5)
WBC: 9.3 10*3/uL (ref 4.0–10.5)

## 2011-08-19 LAB — DIFFERENTIAL
Basophils Absolute: 0.1 10*3/uL (ref 0.0–0.1)
Basophils Relative: 1 % (ref 0–1)
Eosinophils Absolute: 0.1 10*3/uL (ref 0.0–0.7)
Eosinophils Relative: 1 % (ref 0–5)

## 2011-08-19 LAB — ETHANOL: Alcohol, Ethyl (B): 314 mg/dL — ABNORMAL HIGH (ref 0–11)

## 2011-08-20 ENCOUNTER — Emergency Department (HOSPITAL_COMMUNITY): Payer: Self-pay

## 2011-08-20 ENCOUNTER — Emergency Department (HOSPITAL_COMMUNITY)
Admission: EM | Admit: 2011-08-20 | Discharge: 2011-08-20 | Discharge status: Against Medical Advice | Payer: Self-pay | Attending: Emergency Medicine | Admitting: Emergency Medicine

## 2011-08-20 ENCOUNTER — Emergency Department (HOSPITAL_COMMUNITY)
Admission: EM | Admit: 2011-08-20 | Discharge: 2011-08-21 | Disposition: A | Discharge status: Home / Self Care | Payer: Self-pay | Attending: Emergency Medicine | Admitting: Emergency Medicine

## 2011-08-20 DIAGNOSIS — Z9119 Patient's noncompliance with other medical treatment and regimen: Secondary | ICD-10-CM | POA: Insufficient documentation

## 2011-08-20 DIAGNOSIS — F29 Unspecified psychosis not due to a substance or known physiological condition: Secondary | ICD-10-CM | POA: Insufficient documentation

## 2011-08-20 DIAGNOSIS — F1010 Alcohol abuse, uncomplicated: Secondary | ICD-10-CM | POA: Insufficient documentation

## 2011-08-20 DIAGNOSIS — F411 Generalized anxiety disorder: Secondary | ICD-10-CM | POA: Insufficient documentation

## 2011-08-20 DIAGNOSIS — F1020 Alcohol dependence, uncomplicated: Secondary | ICD-10-CM | POA: Insufficient documentation

## 2011-08-20 DIAGNOSIS — G40909 Epilepsy, unspecified, not intractable, without status epilepticus: Secondary | ICD-10-CM | POA: Insufficient documentation

## 2011-08-20 DIAGNOSIS — Z79899 Other long term (current) drug therapy: Secondary | ICD-10-CM | POA: Insufficient documentation

## 2011-08-20 DIAGNOSIS — Z91199 Patient's noncompliance with other medical treatment and regimen due to unspecified reason: Secondary | ICD-10-CM | POA: Insufficient documentation

## 2011-08-20 LAB — RAPID URINE DRUG SCREEN, HOSP PERFORMED
Amphetamines: NOT DETECTED
Barbiturates: NOT DETECTED
Benzodiazepines: NOT DETECTED
Benzodiazepines: NOT DETECTED
Cocaine: NOT DETECTED
Cocaine: NOT DETECTED
Opiates: NOT DETECTED
Opiates: NOT DETECTED
Tetrahydrocannabinol: NOT DETECTED

## 2011-08-20 LAB — DIFFERENTIAL
Eosinophils Absolute: 0.1 10*3/uL (ref 0.0–0.7)
Lymphs Abs: 4.2 10*3/uL — ABNORMAL HIGH (ref 0.7–4.0)
Monocytes Absolute: 0.9 10*3/uL (ref 0.1–1.0)
Monocytes Relative: 10 % (ref 3–12)
Neutrophils Relative %: 44 % (ref 43–77)

## 2011-08-20 LAB — COMPREHENSIVE METABOLIC PANEL
Alkaline Phosphatase: 91 U/L (ref 39–117)
BUN: 6 mg/dL (ref 6–23)
CO2: 30 mEq/L (ref 19–32)
Chloride: 101 mEq/L (ref 96–112)
GFR calc Af Amer: >60 mL/min (ref >60)
GFR calc non Af Amer: >60 mL/min (ref >60)
Glucose, Bld: 95 mg/dL (ref 70–99)
Potassium: 3.1 mEq/L — ABNORMAL LOW (ref 3.5–5.1)
Total Bilirubin: 0.2 mg/dL — ABNORMAL LOW (ref 0.3–1.2)

## 2011-08-20 LAB — URINALYSIS, ROUTINE W REFLEX MICROSCOPIC
Bilirubin Urine: NEGATIVE
Glucose, UA: NEGATIVE mg/dL
Hgb urine dipstick: NEGATIVE
Ketones, ur: NEGATIVE mg/dL
Nitrite: NEGATIVE
Protein, ur: NEGATIVE mg/dL
Specific Gravity, Urine: 1.008 (ref 1.005–1.030)
Urobilinogen, UA: 0.2 mg/dL (ref 0.0–1.0)
pH: 7 (ref 5.0–8.0)

## 2011-08-20 LAB — CBC
MCH: 30.8 pg (ref 26.0–34.0)
MCHC: 34.5 g/dL (ref 30.0–36.0)
MCV: 89.2 fL (ref 78.0–100.0)
Platelets: 409 10*3/uL — ABNORMAL HIGH (ref 150–400)
RBC: 4.45 MIL/uL (ref 3.87–5.11)

## 2011-08-20 LAB — URINE MICROSCOPIC-ADD ON

## 2011-08-20 LAB — POCT PREGNANCY, URINE: Preg Test, Ur: NEGATIVE

## 2011-08-20 LAB — PHENYTOIN LEVEL, TOTAL: Phenytoin Lvl: <2.5 ug/mL — ABNORMAL LOW (ref 10.0–20.0)

## 2011-08-20 LAB — ETHANOL: Alcohol, Ethyl (B): 343 mg/dL — ABNORMAL HIGH (ref 0–11)

## 2011-08-23 ENCOUNTER — Emergency Department (HOSPITAL_COMMUNITY)
Admission: EM | Admit: 2011-08-23 | Discharge: 2011-08-23 | Disposition: A | Discharge status: Home / Self Care | Payer: Self-pay | Attending: Emergency Medicine | Admitting: Emergency Medicine

## 2011-08-23 DIAGNOSIS — R05 Cough: Secondary | ICD-10-CM | POA: Insufficient documentation

## 2011-08-23 DIAGNOSIS — R059 Cough, unspecified: Secondary | ICD-10-CM | POA: Insufficient documentation

## 2011-08-23 DIAGNOSIS — F1010 Alcohol abuse, uncomplicated: Secondary | ICD-10-CM | POA: Insufficient documentation

## 2011-08-24 ENCOUNTER — Inpatient Hospital Stay (HOSPITAL_COMMUNITY)
Admission: EM | Admit: 2011-08-24 | Discharge: 2011-08-26 | DRG: 897 | Disposition: A | Discharge status: Home / Self Care | Payer: Self-pay | Attending: Internal Medicine | Admitting: Internal Medicine

## 2011-08-24 DIAGNOSIS — Z6379 Other stressful life events affecting family and household: Secondary | ICD-10-CM

## 2011-08-24 DIAGNOSIS — R82998 Other abnormal findings in urine: Secondary | ICD-10-CM | POA: Diagnosis present

## 2011-08-24 DIAGNOSIS — F10239 Alcohol dependence with withdrawal, unspecified: Principal | ICD-10-CM | POA: Diagnosis present

## 2011-08-24 DIAGNOSIS — Z882 Allergy status to sulfonamides status: Secondary | ICD-10-CM

## 2011-08-24 DIAGNOSIS — F10939 Alcohol use, unspecified with withdrawal, unspecified: Principal | ICD-10-CM | POA: Diagnosis present

## 2011-08-24 DIAGNOSIS — F329 Major depressive disorder, single episode, unspecified: Secondary | ICD-10-CM | POA: Diagnosis present

## 2011-08-24 DIAGNOSIS — Z5900 Homelessness unspecified: Secondary | ICD-10-CM

## 2011-08-24 DIAGNOSIS — I059 Rheumatic mitral valve disease, unspecified: Secondary | ICD-10-CM | POA: Diagnosis present

## 2011-08-24 DIAGNOSIS — F1010 Alcohol abuse, uncomplicated: Secondary | ICD-10-CM | POA: Diagnosis present

## 2011-08-24 DIAGNOSIS — F1111 Opioid abuse, in remission: Secondary | ICD-10-CM | POA: Diagnosis present

## 2011-08-24 LAB — RAPID URINE DRUG SCREEN, HOSP PERFORMED
Barbiturates: NOT DETECTED
Benzodiazepines: NOT DETECTED
Cocaine: NOT DETECTED

## 2011-08-24 LAB — COMPREHENSIVE METABOLIC PANEL
ALT: 74 U/L — ABNORMAL HIGH (ref 0–35)
Calcium: 8.5 mg/dL (ref 8.4–10.5)
Glucose, Bld: 104 mg/dL — ABNORMAL HIGH (ref 70–99)
Sodium: 141 mEq/L (ref 135–145)
Total Protein: 7.6 g/dL (ref 6.0–8.3)

## 2011-08-24 LAB — DIFFERENTIAL
Basophils Relative: 0 % (ref 0–1)
Eosinophils Absolute: 0 10*3/uL (ref 0.0–0.7)
Eosinophils Relative: 0 % (ref 0–5)
Monocytes Absolute: 1 10*3/uL (ref 0.1–1.0)
Monocytes Relative: 9 % (ref 3–12)
Neutro Abs: 8.6 10*3/uL — ABNORMAL HIGH (ref 1.7–7.7)

## 2011-08-24 LAB — ETHANOL: Alcohol, Ethyl (B): <11 mg/dL (ref 0–11)

## 2011-08-24 LAB — CBC
Hemoglobin: 13.5 g/dL (ref 12.0–15.0)
MCH: 32.1 pg (ref 26.0–34.0)
MCHC: 35.9 g/dL (ref 30.0–36.0)

## 2011-08-25 LAB — URINALYSIS, ROUTINE W REFLEX MICROSCOPIC
Bilirubin Urine: NEGATIVE
Glucose, UA: NEGATIVE mg/dL
Hgb urine dipstick: NEGATIVE
Ketones, ur: NEGATIVE mg/dL
pH: 7.5 (ref 5.0–8.0)

## 2011-08-25 LAB — URINE MICROSCOPIC-ADD ON

## 2011-08-25 LAB — COMPREHENSIVE METABOLIC PANEL
ALT: 49 U/L — ABNORMAL HIGH (ref 0–35)
Albumin: 2.8 g/dL — ABNORMAL LOW (ref 3.5–5.2)
Alkaline Phosphatase: 74 U/L (ref 39–117)
Potassium: 4.1 mEq/L (ref 3.5–5.1)
Sodium: 139 mEq/L (ref 135–145)
Total Protein: 6.3 g/dL (ref 6.0–8.3)

## 2011-08-25 LAB — CBC
HCT: 34.6 % — ABNORMAL LOW (ref 36.0–46.0)
Hemoglobin: 11.6 g/dL — ABNORMAL LOW (ref 12.0–15.0)
MCHC: 33.5 g/dL (ref 30.0–36.0)
MCV: 91.5 fL (ref 78.0–100.0)
RDW: 14.1 % (ref 11.5–15.5)
WBC: 7.2 10*3/uL (ref 4.0–10.5)

## 2011-08-25 LAB — URINE CULTURE: Culture  Setup Time: 201209180154

## 2011-08-26 LAB — COMPREHENSIVE METABOLIC PANEL
ALT: 47 U/L — ABNORMAL HIGH (ref 0–35)
CO2: 31 mEq/L (ref 19–32)
Calcium: 9.3 mg/dL (ref 8.4–10.5)
GFR calc Af Amer: >60 mL/min (ref >60)
GFR calc non Af Amer: >60 mL/min (ref >60)
Glucose, Bld: 86 mg/dL (ref 70–99)
Sodium: 141 mEq/L (ref 135–145)
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
## 2011-08-27 LAB — DIFFERENTIAL
Eosinophils Absolute: 0.1
Eosinophils Relative: 1
Lymphs Abs: 2
Monocytes Absolute: 1
Monocytes Relative: 9

## 2011-08-27 LAB — COMPREHENSIVE METABOLIC PANEL
ALT: 81 — ABNORMAL HIGH
AST: 114 — ABNORMAL HIGH
Albumin: 3.9
CO2: 29
Calcium: 9.2
GFR calc Af Amer: >60
Sodium: 134 — ABNORMAL LOW
Total Protein: 8.2

## 2011-08-27 LAB — URINE MICROSCOPIC-ADD ON

## 2011-08-27 LAB — URINALYSIS, ROUTINE W REFLEX MICROSCOPIC
Bilirubin Urine: NEGATIVE
Glucose, UA: NEGATIVE
Ketones, ur: NEGATIVE
Specific Gravity, Urine: 1.019
pH: 7

## 2011-08-27 LAB — CBC
MCHC: 33.4
Platelets: 482 — ABNORMAL HIGH
RBC: 5
RDW: 12.9

## 2011-09-08 LAB — COMPREHENSIVE METABOLIC PANEL
ALT: 39 — ABNORMAL HIGH
AST: 78 — ABNORMAL HIGH
AST: 79 — ABNORMAL HIGH
Albumin: 2.8 — ABNORMAL LOW
Alkaline Phosphatase: 137 — ABNORMAL HIGH
Alkaline Phosphatase: 144 — ABNORMAL HIGH
BUN: 3 — ABNORMAL LOW
CO2: 27
Calcium: 9.1
Chloride: 107
GFR calc Af Amer: >60
GFR calc Af Amer: >60
Glucose, Bld: 104 — ABNORMAL HIGH
Potassium: 3.7
Potassium: 5.5 — ABNORMAL HIGH
Sodium: 135
Sodium: 136
Total Bilirubin: 1.5 — ABNORMAL HIGH
Total Protein: 5.6 — ABNORMAL LOW
Total Protein: 8

## 2011-09-08 LAB — URINALYSIS, ROUTINE W REFLEX MICROSCOPIC
Bilirubin Urine: NEGATIVE
Glucose, UA: NEGATIVE
Hgb urine dipstick: NEGATIVE
Ketones, ur: NEGATIVE
Protein, ur: NEGATIVE
pH: 7

## 2011-09-08 LAB — DIFFERENTIAL
Basophils Relative: 1
Eosinophils Absolute: 0.3
Eosinophils Relative: 2
Lymphs Abs: 4
Monocytes Absolute: 1.1 — ABNORMAL HIGH
Monocytes Relative: 7
Neutrophils Relative %: 63

## 2011-09-08 LAB — URINE MICROSCOPIC-ADD ON

## 2011-09-08 LAB — CBC
Hemoglobin: 13.5
RBC: 4.47
RDW: 12.7

## 2011-09-09 ENCOUNTER — Emergency Department (HOSPITAL_COMMUNITY)
Admission: EM | Admit: 2011-09-09 | Discharge: 2011-09-10 | Disposition: A | Discharge status: Home / Self Care | Payer: Self-pay | Attending: Emergency Medicine | Admitting: Emergency Medicine

## 2011-09-09 ENCOUNTER — Emergency Department (HOSPITAL_COMMUNITY)
Admission: EM | Admit: 2011-09-09 | Discharge: 2011-09-09 | Disposition: A | Discharge status: Home / Self Care | Payer: Self-pay | Attending: Emergency Medicine | Admitting: Emergency Medicine

## 2011-09-09 DIAGNOSIS — F3289 Other specified depressive episodes: Secondary | ICD-10-CM | POA: Insufficient documentation

## 2011-09-09 DIAGNOSIS — F1010 Alcohol abuse, uncomplicated: Secondary | ICD-10-CM | POA: Insufficient documentation

## 2011-09-09 DIAGNOSIS — Z79899 Other long term (current) drug therapy: Secondary | ICD-10-CM | POA: Insufficient documentation

## 2011-09-09 DIAGNOSIS — F329 Major depressive disorder, single episode, unspecified: Secondary | ICD-10-CM | POA: Insufficient documentation

## 2011-09-11 ENCOUNTER — Emergency Department (HOSPITAL_COMMUNITY)
Admission: EM | Admit: 2011-09-11 | Discharge: 2011-09-12 | Disposition: A | Discharge status: Home / Self Care | Payer: Self-pay | Attending: Emergency Medicine | Admitting: Emergency Medicine

## 2011-09-11 ENCOUNTER — Emergency Department (HOSPITAL_COMMUNITY): Payer: Self-pay

## 2011-09-11 DIAGNOSIS — R5381 Other malaise: Secondary | ICD-10-CM | POA: Insufficient documentation

## 2011-09-11 DIAGNOSIS — F1020 Alcohol dependence, uncomplicated: Secondary | ICD-10-CM | POA: Insufficient documentation

## 2011-09-11 DIAGNOSIS — F1010 Alcohol abuse, uncomplicated: Secondary | ICD-10-CM | POA: Insufficient documentation

## 2011-09-11 DIAGNOSIS — R42 Dizziness and giddiness: Secondary | ICD-10-CM | POA: Insufficient documentation

## 2011-09-11 DIAGNOSIS — E876 Hypokalemia: Secondary | ICD-10-CM | POA: Insufficient documentation

## 2011-09-11 LAB — BASIC METABOLIC PANEL
CO2: 32 mEq/L (ref 19–32)
Calcium: 8.4 mg/dL (ref 8.4–10.5)
Chloride: 104 mEq/L (ref 96–112)
Glucose, Bld: 94 mg/dL (ref 70–99)
Sodium: 144 mEq/L (ref 135–145)

## 2011-09-11 LAB — CBC
HCT: 38.3 % (ref 36.0–46.0)
Hemoglobin: 12.9 g/dL (ref 12.0–15.0)
MCH: 31.4 pg (ref 26.0–34.0)
MCHC: 33.7 g/dL (ref 30.0–36.0)
MCV: 93.2 fL (ref 78.0–100.0)

## 2011-09-11 LAB — DIFFERENTIAL
Basophils Relative: 2 % — ABNORMAL HIGH (ref 0–1)
Lymphs Abs: 3.8 10*3/uL (ref 0.7–4.0)
Monocytes Absolute: 0.8 10*3/uL (ref 0.1–1.0)
Monocytes Relative: 10 % (ref 3–12)
Neutro Abs: 3.2 10*3/uL (ref 1.7–7.7)

## 2011-09-11 LAB — RAPID URINE DRUG SCREEN, HOSP PERFORMED
Amphetamines: NOT DETECTED
Benzodiazepines: NOT DETECTED
Cocaine: NOT DETECTED

## 2011-09-12 LAB — ETHANOL: Alcohol, Ethyl (B): 113 mg/dL — ABNORMAL HIGH (ref 0–11)

## 2011-09-14 ENCOUNTER — Emergency Department (HOSPITAL_COMMUNITY)
Admission: EM | Admit: 2011-09-14 | Discharge: 2011-09-14 | Disposition: A | Discharge status: Home / Self Care | Payer: Self-pay | Attending: Emergency Medicine | Admitting: Emergency Medicine

## 2011-09-16 LAB — CBC
MCHC: 33.5
MCV: 87.8
RDW: 13.5

## 2011-09-16 LAB — COMPREHENSIVE METABOLIC PANEL
ALT: 43 — ABNORMAL HIGH
AST: 70 — ABNORMAL HIGH
Calcium: 9.1
Creatinine, Ser: 0.65
GFR calc Af Amer: >60
Sodium: 140
Total Protein: 6.5

## 2011-09-16 LAB — DIFFERENTIAL
Eosinophils Absolute: 0.4
Lymphocytes Relative: 29
Lymphs Abs: 3.4 — ABNORMAL HIGH
Monocytes Relative: 11
Neutrophils Relative %: 55

## 2011-09-16 LAB — LIPASE, BLOOD: Lipase: 36

## 2011-09-16 LAB — URINE MICROSCOPIC-ADD ON

## 2011-09-16 LAB — URINALYSIS, ROUTINE W REFLEX MICROSCOPIC
Nitrite: NEGATIVE
Specific Gravity, Urine: 1.018
pH: 7

## 2011-09-16 NOTE — Discharge Summary (Signed)
NAME:  Lauren Lucas, KITCH NO.:  [id number]  MEDICAL RECORD NO.:  [id number]  LOCATION:                                 FACILITY:  PHYSICIAN:  Baltazar Najjar, MD     DATE OF BIRTH:  08/01/1956  DATE OF ADMISSION: DATE OF DISCHARGE:                              DISCHARGE SUMMARY   FINAL DISCHARGE DIAGNOSES: 1. Alcohol abuse/withdrawal. 2. Depression. 3. Pyuria with no evidence of urinary tract infection. 4. History of heroin use in the past. 5. Poor social situation.  RADIOLOGY/IMAGING STUDIES:  None.  CONSULTATIONS:  None.  BRIEF ADMITTING HISTORY:  Please refer to H and P for more details.  On summary, Ms. Lauren Lucas is a 55 year old homeless patient with history of EtOH abuse, multiple hospitalization for withdrawal, brought into the ER with similar symptoms basically binge drinking and tremors. Please refer to H and P for more details.  HOSPITAL COURSE: 1. The patient was admitted to the telemetry floor.  She was placed on     CIWA protocol.  She did really require high doses of Ativan, her     score was low.  She was transitioned to Ativan tapering dose, which     will be discharged on. 2. Depression.  The patient was continued on her Celexa. 3. Pyuria.  The patient was initially started on ciprofloxacin.     However, her urine culture grew microbial organism which is most     likely contaminant.  She received two doses of ciprofloxacin and     antibiotic will be discontinued. 4. Poor social situation.  The patient is homeless.  She is to be a     resident in a shelter in [place] and [address], however, she     was seen by Child psychotherapist and given options for inpatient rehab     which she refused.  She is also refuses to go to any shelter in     [facility] area.  She was not accepted to [facility] shelter as per     Child psychotherapist and she is currently alert, oriented x3, and Medical laboratory scientific officer is trying to explore any options for her for  discharge which     is not seems to be promising at this time.  The patient was seen and examined by me today.  She is alert, oriented x3.  She had no tremors.  Exam was unremarkable.  Vital signs, blood pressure 108/66, heart rate of 64, temperature 98.2, O2 sats 98% on room air.  LAB RESULTS:  Her potassium was 3.9, creatinine 0.48, AST/ALT was 42/47. Urine culture showed polymicrobial organisms.  The patient is medically stable for discharge.  She is currently on Ativan taper, will continue for discharge.  She has no symptoms of withdrawal at the time of discharge.  DISCHARGE MEDICATIONS: 1. Folic acid 1 mg p.o. daily. 2. Lorazepam take one tablet p.o. tonight, then one tablet p.o. b.i.d.     for 2 days, then one tablet p.o. daily for 2 days, then stop.  She     is given a 5-day supply of 7 tablets of Ativan. 3. Multivitamin one capsule p.o. daily. 4.  Thiamine 100 mg p.o. daily. 5. Celexa 20 mg p.o. at bedtime.  DISCHARGE INSTRUCTIONS: 1. The patient is to continue above medications as prescribed. 2. Social worker to provide the patient with resources for EtOH, rehab     programs, and to explore any other options for shelter placement or     any other options. 3. Case manager to help the patient with PCP arrangement .          ______________________________ Baltazar Najjar, MD     SA/MEDQ  D:  08/26/2011  T:  08/26/2011  Job:  [id number]  Electronically Signed by Hannah Beat MD on 09/16/2011 09:07:24 PM

## 2011-09-23 NOTE — H&P (Signed)
NAME:  Lauren Lucas, Lauren Lucas NO.:  [id number]  MEDICAL RECORD NO.:  [id number]  LOCATION:  [facility]                         FACILITY:  [facility]  PHYSICIAN:  Calvert Cantor, M.D.     DATE OF BIRTH:  November 07, 1956  DATE OF ADMISSION:  08/13/2011 DATE OF DISCHARGE:                             HISTORY & PHYSICAL   PRIMARY CARE PHYSICIAN:  None.  PRESENTING COMPLAINT:  Brought in after a seizure.  HISTORY OF PRESENT ILLNESS:  This is a 55 year old female with depression and a history of alcohol abuse and heroin abuse in the past.  The patient was seen in the ER yesterday.  According to the ER doctor, she was brought in for intoxication.  The ER doctor did not feel that she needed detoxification as she had not drank in about a week and half. She was discharged from the ER.  This morning, the patient admits to drinking half a can of beer.  Of note, the patient drinks the large cans of beer which are 24 ounces. She then went to the court house.  While in line in the court house, the patient apparently had a tonic-clonic seizure and was brought into the ER.  The patient states that other than the one large can of beer she had last night and this morning, she has not been drinking in the past few days.  She is awake and alert after her seizure and does not admit to any pain or injuries.  She initially did not want to be in the hospital for monitoring, but has now agreed to stay the night to be monitored.  Apparently, the patient has had seizures in the past.  She states her last seizure was 1 year ago and she thinks it was an alcohol withdrawal seizure.  When looking to the medical records and [facility], it appears that she was admitted to the Psych Behavioral Medicine unit in December of last year and on her third day of admission had a seizure, which was associated with alcohol withdrawal.  PAST MEDICAL HISTORY: 1. Alcohol abuse. 2. Heroin use in the past, but not currently. 3.  Mitral valve prolapse. 4. Depression.  PAST SURGICAL HISTORY: 1. Partial hysterectomy. 2. Splenectomy. 3. Cholecystectomy. 4. Exploratory lap  SOCIAL HISTORY:  The patient states she has never smoked.  She is currently not using any drugs.  She is drinking 2 large cans of beer a day as mentioned in the H and P, she had not drank for about a week and half and restarted last night.  The patient has been homeless for the past 2 years.  FAMILY HISTORY:  Mother has melanoma.  ALLERGIES:  To SULFA, BENADRYL, and VISTARIL.  CURRENT MEDICATIONS:  Celexa 20 mg daily.  PHYSICAL EXAMINATION:  GENERAL: Middle-aged female lying in bed in no acute distress. VITAL SIGNS:  Blood pressure 114/65, pulse 67, respiratory rate 18, temperature 98 degrees, oxygen level is 94% on room air. HEENT:  Pupils equal, round, and reacting to light and accommodation. Extraocular movements are intact.  Conjunctivae is pink.  Oral mucosa is moist.  She is wearing dentures.  Oropharynx is clear. NECK:  Supple.  No thyromegaly, lymphadenopathy, or carotid bruits.  HEART:  Regular rate and rhythm.  No murmurs, rubs, or gallops. LUNGS: Clear bilaterally with a normal respiratory effort. ABDOMEN: Soft, nontender, nondistended.  Bowel sounds positive.  No organomegaly. EXTREMITIES: No cyanosis, clubbing, or edema.  Pedal pulses are positive. NEUROLOGICALLY:  Cranial nerves II-XII are intact.  Strength is intact in all 4 extremities. PSYCHOLOGICALLY:  She does appear depressed, but overall is quite communicative.  LABORATORY DATA:  Pertinent blood work:  Metabolic panel reveals a potassium of 3.1, glucose 129, total bili is low at 0.2, AST is slightly elevated at 45.  Alcohol level is 287.  Urine drug screen is negative.  CBC is within normal limits.  UA; there are trace leukocytes, 3 to 6 WBCs and rare bacteria, specific gravity is 1.004.  CT of the head without contrast on August 13, 2011,  impression negative exam.  EKG has not been done, but on the telemetry monitor, the patient is in normal sinus rhythm with a heart rate in the 60s.  ASSESSMENT AND PLAN: 1. Seizure.  It does not appear to be an alcohol withdrawal seizure.     I will admit her and monitor her on telemetry.  We will get an EEG     and a neurology consult.  She may have had withdrawal seizures in     the past, but currently she has been drinking, has alcohol in her     system, and she did not apparently have any seizures in the past     week and half when she was abstaining from drinking.  We will give     her Ativan if she is to have further seizures during the hospital     stay. 2. Depression.  We will continue her Celexa. 3. Homelessness.  We will ask for social services consult. 4. Hypokalemia.  We will replace p.o.  Time on admission was 60 minutes.     Calvert Cantor, M.D.     SR/MEDQ  D:  08/13/2011  T:  08/13/2011  Job:  [id number]  Electronically Signed by Calvert Cantor M.D. on 09/23/2011 07:12:17 PM

## 2011-12-25 IMAGING — CT CT HEAD W/O CM
2 series · 17 of 30 positions shown, 20 images · non-contrast
Comparison: Head CT scan 05/31/2011.

CLINICAL DATA: Seizure.

CT HEAD WITHOUT CONTRAST
TECHNIQUE: Contiguous axial images were obtained from the base of
the skull through the vertex without contrast.

[Series 2: head w/o · axial · non-contrast · 0.39mm/px · z∈[-68,+47]mm · 9 of 29 slices shown, 12 images]
[im 3/29  brain]
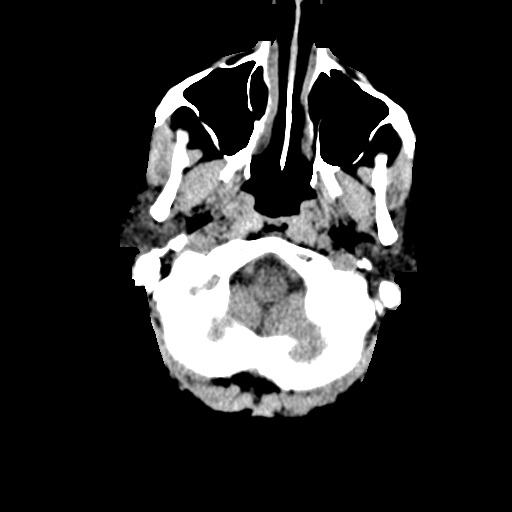
[im 3/29  bone]
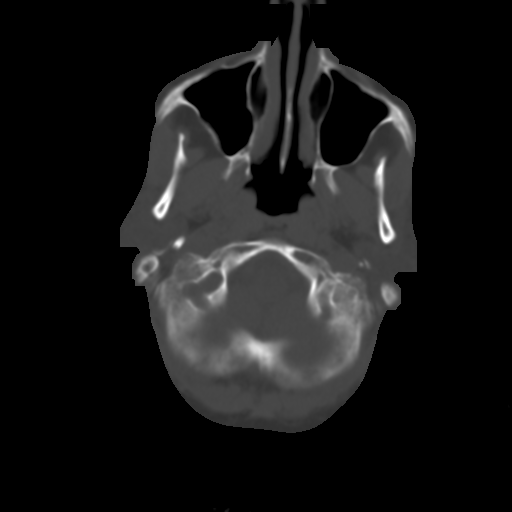
[im 6/29  brain]
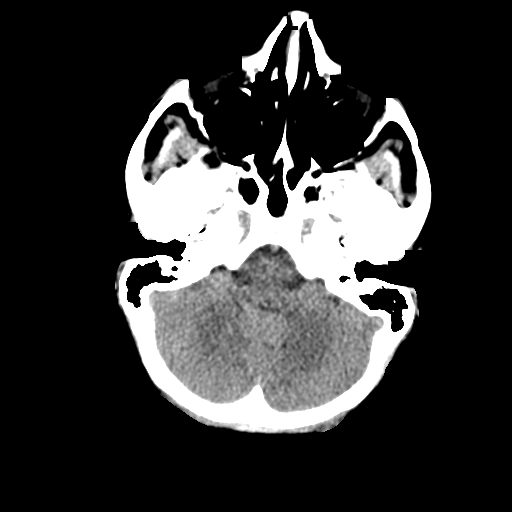
[im 9/29  brain]
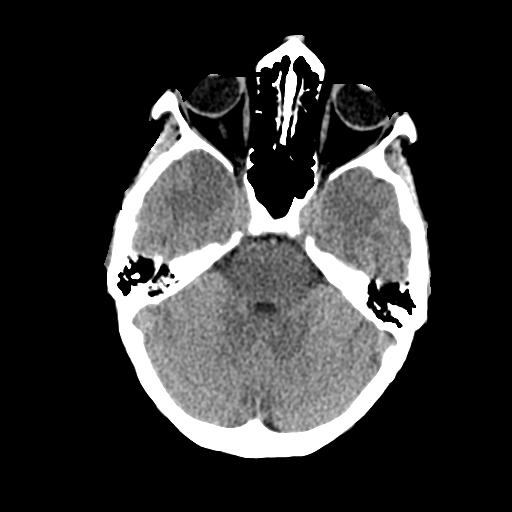
[im 12/29  brain]
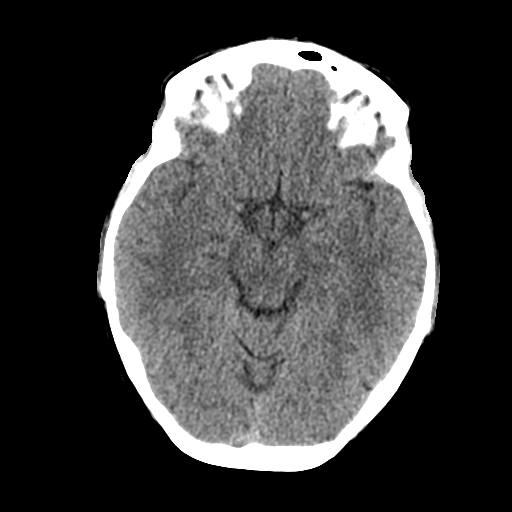
[im 15/29  brain]
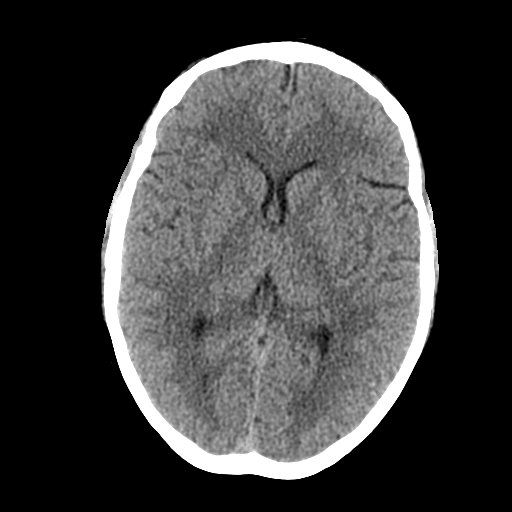
[im 15/29  bone]
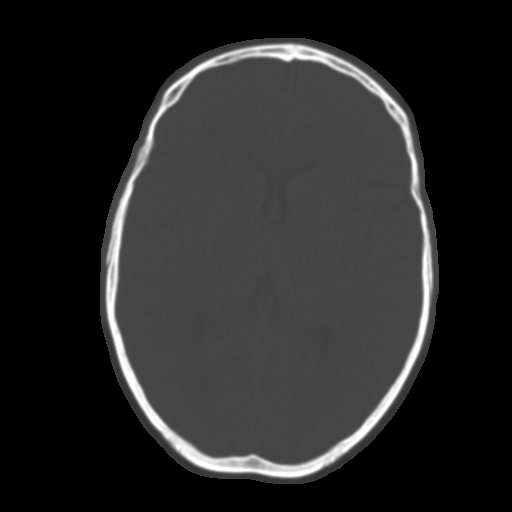
[im 17/29  brain]
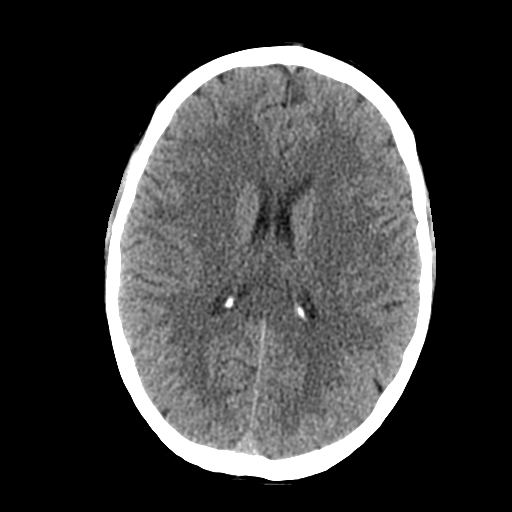
[im 20/29  brain]
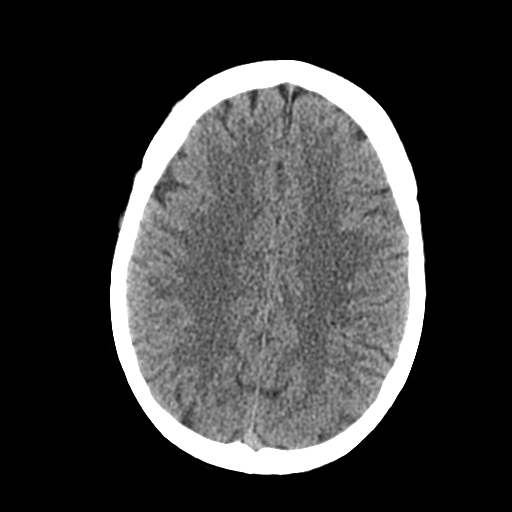
[im 23/29  brain]
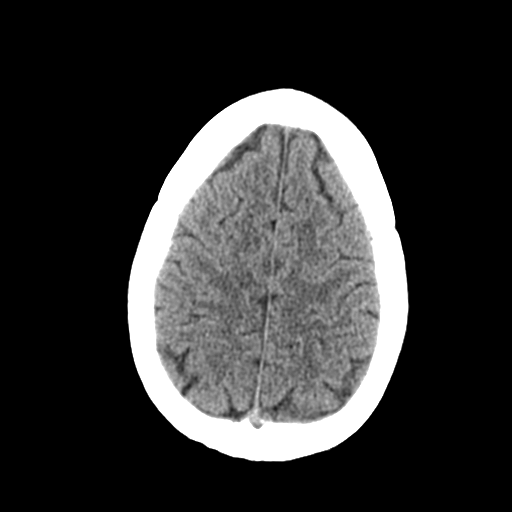
[im 26/29  brain]
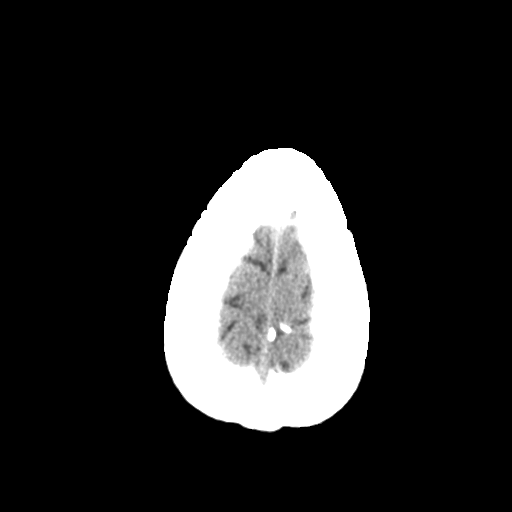
[im 26/29  bone]
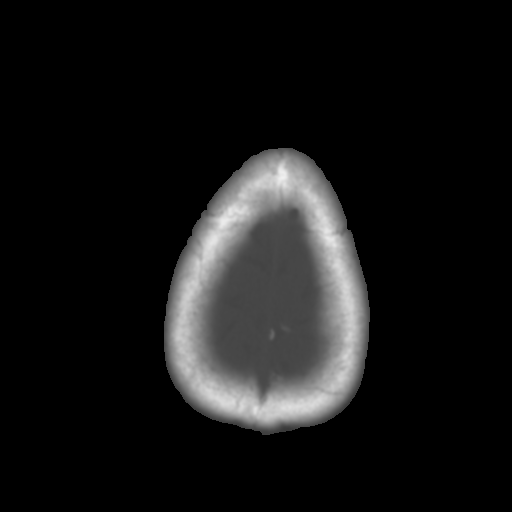

[Series 3: bone windows · axial · 0.39mm/px · z∈[-63,+48]mm · 8 of 49 slices shown]
[im 6/49  bone]
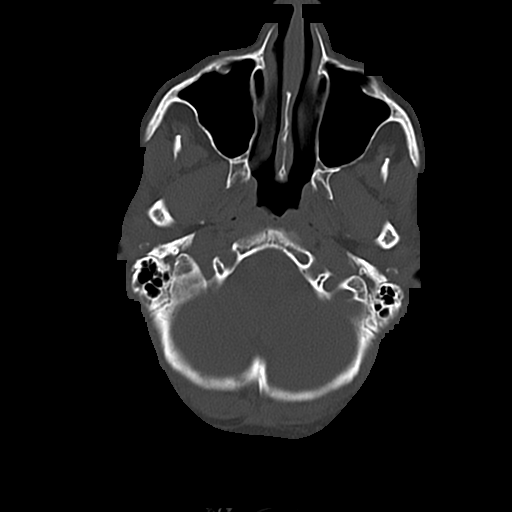
[im 11/49  bone]
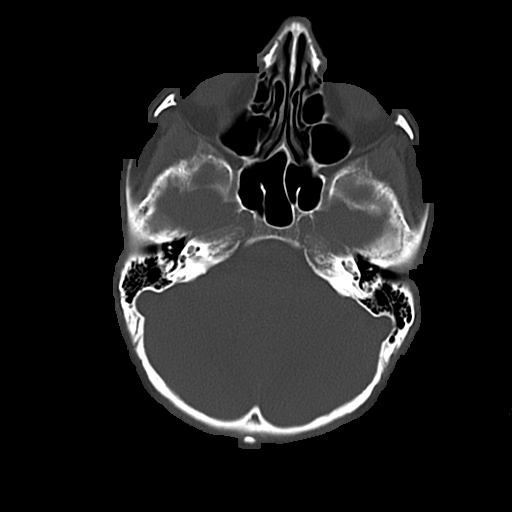
[im 17/49  bone]
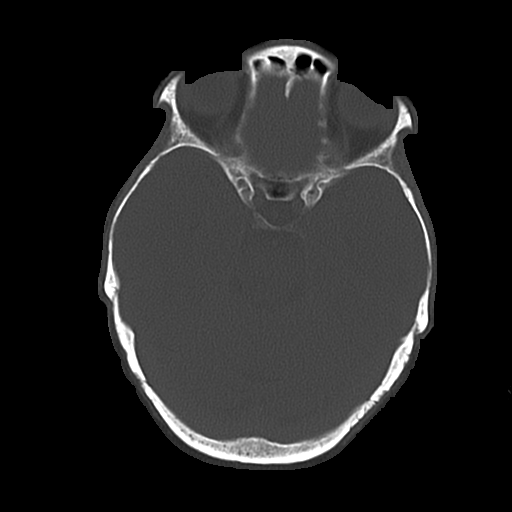
[im 22/49  bone]
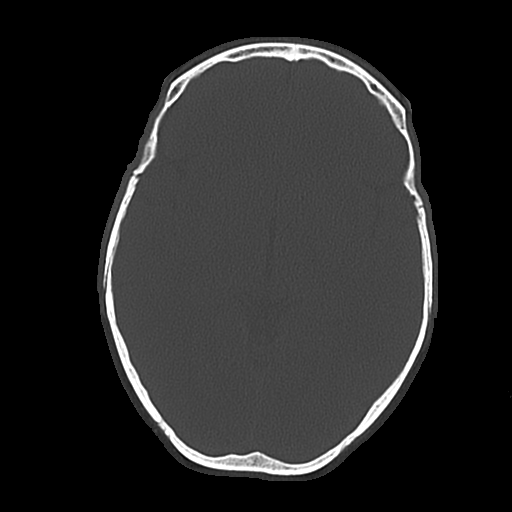
[im 27/49  bone]
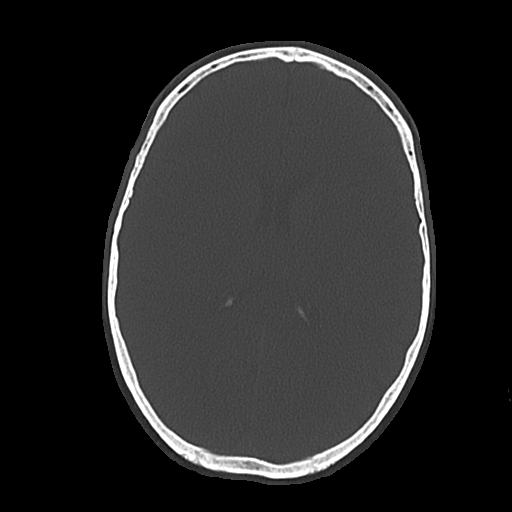
[im 33/49  bone]
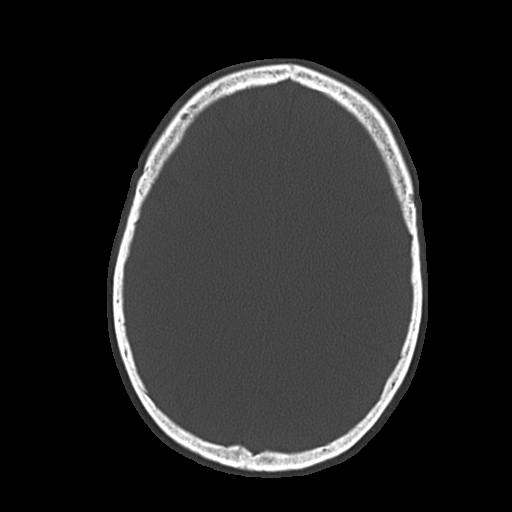
[im 38/49  bone]
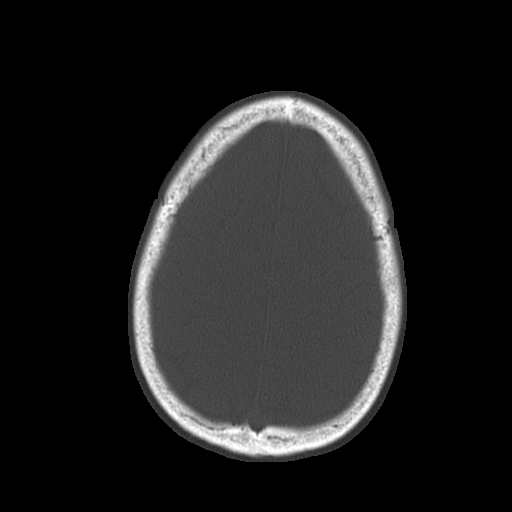
[im 43/49  bone]
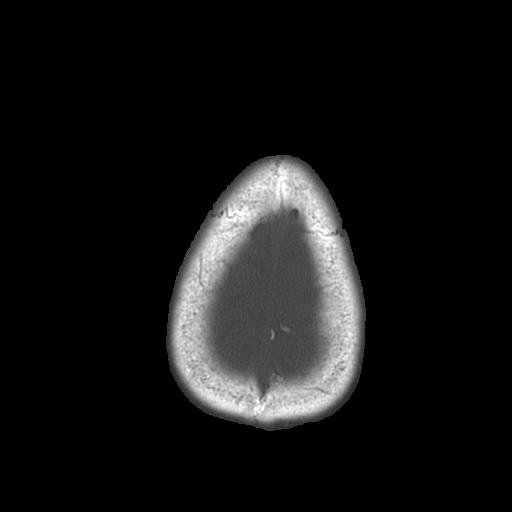

[17 of 30 positions shown; findings below may reference images not displayed]

FINDINGS: The brain appears normal without evidence of acute
infarction, hemorrhage, mass lesion, mass effect, midline shift or
abnormal extra-axial fluid collection.  No hydrocephalus or
pneumocephalus.  Calvarium intact.
IMPRESSION: Negative exam.

## 2013-01-18 ENCOUNTER — Encounter (HOSPITAL_COMMUNITY): Payer: Self-pay

## 2013-01-18 ENCOUNTER — Emergency Department (HOSPITAL_COMMUNITY)
Admission: EM | Admit: 2013-01-18 | Discharge: 2013-01-19 | Disposition: A | Discharge status: Other Acute Inpatient Hospital | Payer: Medicaid Other | Source: Home / Self Care | Attending: Emergency Medicine | Admitting: Emergency Medicine

## 2013-01-18 DIAGNOSIS — R5381 Other malaise: Secondary | ICD-10-CM | POA: Insufficient documentation

## 2013-01-18 DIAGNOSIS — Z79899 Other long term (current) drug therapy: Secondary | ICD-10-CM

## 2013-01-18 DIAGNOSIS — F19939 Other psychoactive substance use, unspecified with withdrawal, unspecified: Principal | ICD-10-CM | POA: Diagnosis present

## 2013-01-18 DIAGNOSIS — Z8619 Personal history of other infectious and parasitic diseases: Secondary | ICD-10-CM | POA: Insufficient documentation

## 2013-01-18 DIAGNOSIS — Z9889 Other specified postprocedural states: Secondary | ICD-10-CM | POA: Insufficient documentation

## 2013-01-18 DIAGNOSIS — N390 Urinary tract infection, site not specified: Secondary | ICD-10-CM | POA: Insufficient documentation

## 2013-01-18 DIAGNOSIS — F1110 Opioid abuse, uncomplicated: Secondary | ICD-10-CM

## 2013-01-18 DIAGNOSIS — R6889 Other general symptoms and signs: Secondary | ICD-10-CM | POA: Insufficient documentation

## 2013-01-18 DIAGNOSIS — F411 Generalized anxiety disorder: Secondary | ICD-10-CM | POA: Diagnosis present

## 2013-01-18 DIAGNOSIS — F1120 Opioid dependence, uncomplicated: Secondary | ICD-10-CM | POA: Diagnosis present

## 2013-01-18 DIAGNOSIS — Z8679 Personal history of other diseases of the circulatory system: Secondary | ICD-10-CM | POA: Insufficient documentation

## 2013-01-18 DIAGNOSIS — F1020 Alcohol dependence, uncomplicated: Secondary | ICD-10-CM | POA: Insufficient documentation

## 2013-01-18 DIAGNOSIS — Z8659 Personal history of other mental and behavioral disorders: Secondary | ICD-10-CM | POA: Insufficient documentation

## 2013-01-18 DIAGNOSIS — F329 Major depressive disorder, single episode, unspecified: Secondary | ICD-10-CM | POA: Diagnosis present

## 2013-01-18 HISTORY — DX: Major depressive disorder, single episode, unspecified: F32.9

## 2013-01-18 HISTORY — DX: Opioid dependence, uncomplicated: F11.20

## 2013-01-18 HISTORY — DX: Nonrheumatic mitral (valve) prolapse: I34.1

## 2013-01-18 HISTORY — DX: Depression, unspecified: F32.A

## 2013-01-18 LAB — URINALYSIS, ROUTINE W REFLEX MICROSCOPIC
Glucose, UA: NEGATIVE mg/dL
Ketones, ur: 15 mg/dL — AB
Protein, ur: NEGATIVE mg/dL
Urobilinogen, UA: 1 mg/dL (ref 0.0–1.0)

## 2013-01-18 LAB — CBC WITH DIFFERENTIAL/PLATELET
Basophils Absolute: 0.1 10*3/uL (ref 0.0–0.1)
Eosinophils Relative: 0 % (ref 0–5)
HCT: 38.6 % (ref 36.0–46.0)
Hemoglobin: 13.4 g/dL (ref 12.0–15.0)
Lymphocytes Relative: 29 % (ref 12–46)
Lymphs Abs: 2.6 10*3/uL (ref 0.7–4.0)
MCV: 82.1 fL (ref 78.0–100.0)
Monocytes Absolute: 0.7 10*3/uL (ref 0.1–1.0)
Monocytes Relative: 8 % (ref 3–12)
Neutro Abs: 5.5 10*3/uL (ref 1.7–7.7)
RDW: 13.4 % (ref 11.5–15.5)
WBC: 8.9 10*3/uL (ref 4.0–10.5)

## 2013-01-18 LAB — COMPREHENSIVE METABOLIC PANEL
AST: 69 U/L — ABNORMAL HIGH (ref 0–37)
BUN: 6 mg/dL (ref 6–23)
CO2: 24 mEq/L (ref 19–32)
Calcium: 9.2 mg/dL (ref 8.4–10.5)
Chloride: 102 mEq/L (ref 96–112)
Creatinine, Ser: 0.55 mg/dL (ref 0.50–1.10)
GFR calc Af Amer: >90 mL/min (ref >90)
GFR calc non Af Amer: >90 mL/min (ref >90)
Glucose, Bld: 89 mg/dL (ref 70–99)
Total Bilirubin: 0.3 mg/dL (ref 0.3–1.2)

## 2013-01-18 LAB — RAPID URINE DRUG SCREEN, HOSP PERFORMED
Amphetamines: NOT DETECTED
Opiates: POSITIVE — AB

## 2013-01-18 LAB — URINE MICROSCOPIC-ADD ON

## 2013-01-18 LAB — ETHANOL: Alcohol, Ethyl (B): <11 mg/dL (ref 0–11)

## 2013-01-18 MED ORDER — ZOLPIDEM TARTRATE 5 MG PO TABS
5.0000 mg | ORAL_TABLET | Freq: Every evening | ORAL | Status: DC | PRN
  Administered 2013-01-18: 5 mg via ORAL
  Filled 2013-01-18: qty 1

## 2013-01-18 MED ORDER — CLONIDINE HCL 0.1 MG PO TABS: 0.1000 mg | ORAL_TABLET | ORAL | Status: DC

## 2013-01-18 MED ORDER — IBUPROFEN 600 MG PO TABS: 600.0000 mg | ORAL_TABLET | Freq: Three times a day (TID) | ORAL | Status: DC | PRN

## 2013-01-18 MED ORDER — NAPROXEN 500 MG PO TABS: 500.0000 mg | ORAL_TABLET | Freq: Two times a day (BID) | ORAL | Status: DC | PRN

## 2013-01-18 MED ORDER — DICYCLOMINE HCL 20 MG PO TABS: 20.0000 mg | ORAL_TABLET | Freq: Four times a day (QID) | ORAL | Status: DC | PRN

## 2013-01-18 MED ORDER — HYDROXYZINE HCL 25 MG PO TABS
25.0000 mg | ORAL_TABLET | Freq: Four times a day (QID) | ORAL | Status: DC | PRN
  Filled 2013-01-18: qty 1

## 2013-01-18 MED ORDER — CIPROFLOXACIN HCL 500 MG PO TABS
500.0000 mg | ORAL_TABLET | Freq: Two times a day (BID) | ORAL | Status: DC
  Administered 2013-01-19 (×2): 500 mg via ORAL
  Filled 2013-01-18 (×2): qty 1

## 2013-01-18 MED ORDER — ONDANSETRON 4 MG PO TBDP
4.0000 mg | ORAL_TABLET | Freq: Four times a day (QID) | ORAL | Status: DC | PRN
  Administered 2013-01-18: 4 mg via ORAL
  Filled 2013-01-18: qty 1

## 2013-01-18 MED ORDER — METHOCARBAMOL 500 MG PO TABS: 500.0000 mg | ORAL_TABLET | Freq: Three times a day (TID) | ORAL | Status: DC | PRN

## 2013-01-18 MED ORDER — CLONIDINE HCL 0.1 MG PO TABS: 0.1000 mg | ORAL_TABLET | Freq: Every day | ORAL | Status: DC

## 2013-01-18 MED ORDER — LOPERAMIDE HCL 2 MG PO CAPS: 2.0000 mg | ORAL_CAPSULE | ORAL | Status: DC | PRN

## 2013-01-18 MED ORDER — ACETAMINOPHEN 325 MG PO TABS: 650.0000 mg | ORAL_TABLET | ORAL | Status: DC | PRN

## 2013-01-18 MED ORDER — CLONIDINE HCL 0.1 MG PO TABS
0.1000 mg | ORAL_TABLET | Freq: Four times a day (QID) | ORAL | Status: DC
  Administered 2013-01-18 – 2013-01-19 (×2): 0.1 mg via ORAL
  Filled 2013-01-18 (×2): qty 1

## 2013-01-18 NOTE — ED Notes (Signed)
Pt presents for detox from Heroin & Methadone.  Last used yesterday at 5pm.  Denies SI or HI.  Not feeling hopeless.  Pt reports she was 5 yrs clean.  Calm & cooperative at present.

## 2013-01-18 NOTE — ED Notes (Addendum)
Patient is requesting detox from heroin and methadone. Patient states she last used at 1700 yesterday. Patient denies SI/HI.

## 2013-01-18 NOTE — ED Provider Notes (Signed)
History     CSN: [id number]  Arrival date & time 01/18/13  1707   First MD Initiated Contact with Patient 01/18/13 1717      Chief Complaint  Patient presents with  . Medical Clearance  . Drug Problem    (Consider location/radiation/quality/duration/timing/severity/associated sxs/prior treatment) Patient is a 57 y.o. female presenting with drug problem.  Drug Problem Pertinent negatives include no chest pain, no abdominal pain, no headaches and no shortness of breath.   patient is requesting detox off of heroin and methadone. She's been using to 3 bags of heroin a day. She states she has tapered off in the last week. She also been using approximately 10 mg of methadone a day. She's been injecting heroin. No fevers. She states she realizes she needs to get help. In the last 2 years her longest sobriety has been about one year. She relapsed in August. No suicidal thoughts, all though she is mildly depressed. She's previously been in treatment. She also has had a previous history of alcoholic abuse, she states she's not used recently.  Past Medical History  Diagnosis Date  . Mitral valve prolapse   . Heroin addiction   . Depression   . Anxiety   . Hepatitis     Hep C for 10 years, asymtomatic    Past Surgical History  Procedure Laterality Date  . Splenectomy    . Dental surgery      Family History  Problem Relation Age of Onset  . Hypertension Mother   . Cancer Mother   . Hypertension Father   . Heart failure Father     History  Substance Use Topics  . Smoking status: Never Smoker   . Smokeless tobacco: Never Used  . Alcohol Use: No     Comment: None for one and half years    OB History   Grav Para Term Preterm Abortions TAB SAB Ect Mult Living                  Review of Systems  Constitutional: Negative for activity change and appetite change.  HENT: Negative for neck stiffness.   Eyes: Negative for pain.  Respiratory: Negative for chest tightness and  shortness of breath.   Cardiovascular: Negative for chest pain and leg swelling.  Gastrointestinal: Negative for nausea, vomiting, abdominal pain and diarrhea.  Genitourinary: Negative for flank pain.  Musculoskeletal: Negative for back pain.  Skin: Negative for rash.  Neurological: Negative for weakness, numbness and headaches.  Psychiatric/Behavioral: Positive for dysphoric mood. Negative for behavioral problems.    Allergies  Antihistamines, diphenhydramine-type and Sulfa antibiotics  Home Medications  No current outpatient prescriptions on file.  BP 136/75  Pulse 74  Temp(Src) 99.4 F (37.4 C) (Oral)  Resp 16  SpO2 97%  Physical Exam  Nursing note and vitals reviewed. Constitutional: She is oriented to person, place, and time. She appears well-developed and well-nourished.  HENT:  Head: Normocephalic and atraumatic.  Eyes: EOM are normal. Pupils are equal, round, and reactive to light.  Neck: Normal range of motion. Neck supple.  Cardiovascular: Normal rate, regular rhythm and normal heart sounds.   No murmur heard. Pulmonary/Chest: Effort normal and breath sounds normal. No respiratory distress. She has no wheezes. She has no rales.  Abdominal: Soft. Bowel sounds are normal. She exhibits no distension. There is no tenderness. There is no rebound and no guarding.  Musculoskeletal: Normal range of motion.  Neurological: She is alert and oriented to person, place, and time.  No cranial nerve deficit.  Skin: Skin is warm and dry.  Psychiatric: She has a normal mood and affect. Her speech is normal.    ED Course  Procedures (including critical care time)  Labs Reviewed  CBC WITH DIFFERENTIAL - Abnormal; Notable for the following:    Platelets 443 (*)    All other components within normal limits  COMPREHENSIVE METABOLIC PANEL - Abnormal; Notable for the following:    Potassium 3.4 (*)    AST 69 (*)    ALT 62 (*)    All other components within normal limits   URINALYSIS, ROUTINE W REFLEX MICROSCOPIC - Abnormal; Notable for the following:    APPearance TURBID (*)    Ketones, ur 15 (*)    Leukocytes, UA MODERATE (*)    All other components within normal limits  URINE RAPID DRUG SCREEN (HOSP PERFORMED) - Abnormal; Notable for the following:    Opiates POSITIVE (*)    All other components within normal limits  URINE MICROSCOPIC-ADD ON - Abnormal; Notable for the following:    Squamous Epithelial / LPF FEW (*)    Bacteria, UA FEW (*)    All other components within normal limits  URINE CULTURE  ETHANOL   No results found.   1. Opioid abuse   2. UTI (urinary tract infection)       MDM  Patient with opioid abuse. History same. Not suicidal. Has urinary tract infection. The patient medically cleared        Juliet Rude. Rubin Payor, MD 01/20/13 [phone]

## 2013-01-19 ENCOUNTER — Encounter (HOSPITAL_COMMUNITY): Payer: Self-pay | Admitting: *Deleted

## 2013-01-19 ENCOUNTER — Inpatient Hospital Stay (HOSPITAL_COMMUNITY)
Admission: AD | Admit: 2013-01-19 | Discharge: 2013-01-26 | DRG: 897 | Disposition: A | Discharge status: Home / Self Care | Payer: Medicaid Other | Source: Intra-hospital | Attending: Psychiatry | Admitting: Psychiatry

## 2013-01-19 DIAGNOSIS — F329 Major depressive disorder, single episode, unspecified: Secondary | ICD-10-CM | POA: Diagnosis present

## 2013-01-19 DIAGNOSIS — F1193 Opioid use, unspecified with withdrawal: Secondary | ICD-10-CM | POA: Diagnosis present

## 2013-01-19 DIAGNOSIS — F411 Generalized anxiety disorder: Secondary | ICD-10-CM | POA: Diagnosis present

## 2013-01-19 DIAGNOSIS — F1120 Opioid dependence, uncomplicated: Secondary | ICD-10-CM | POA: Diagnosis present

## 2013-01-19 HISTORY — DX: Inflammatory liver disease, unspecified: K75.9

## 2013-01-19 HISTORY — DX: Anxiety disorder, unspecified: F41.9

## 2013-01-19 MED ORDER — MAGNESIUM HYDROXIDE 400 MG/5ML PO SUSP: 30.0000 mL | Freq: Every day | ORAL | Status: DC | PRN

## 2013-01-19 MED ORDER — ACETAMINOPHEN 325 MG PO TABS: 650.0000 mg | ORAL_TABLET | Freq: Four times a day (QID) | ORAL | Status: DC | PRN

## 2013-01-19 MED ORDER — CIPROFLOXACIN HCL 500 MG PO TABS
500.0000 mg | ORAL_TABLET | Freq: Two times a day (BID) | ORAL | Status: DC
  Administered 2013-01-19 – 2013-01-20 (×2): 500 mg via ORAL
  Filled 2013-01-19 (×8): qty 1

## 2013-01-19 MED ORDER — ALUM & MAG HYDROXIDE-SIMETH 200-200-20 MG/5ML PO SUSP: 30.0000 mL | ORAL | Status: DC | PRN

## 2013-01-19 MED ORDER — DICYCLOMINE HCL 20 MG PO TABS: 20.0000 mg | ORAL_TABLET | Freq: Four times a day (QID) | ORAL | Status: AC | PRN

## 2013-01-19 MED ORDER — METHOCARBAMOL 500 MG PO TABS
500.0000 mg | ORAL_TABLET | Freq: Three times a day (TID) | ORAL | Status: AC | PRN
  Administered 2013-01-22 – 2013-01-23 (×2): 500 mg via ORAL
  Filled 2013-01-19 (×2): qty 1

## 2013-01-19 MED ORDER — CLONIDINE HCL 0.1 MG PO TABS: 0.1000 mg | ORAL_TABLET | Freq: Every day | ORAL | Status: DC

## 2013-01-19 MED ORDER — NAPROXEN 500 MG PO TABS
500.0000 mg | ORAL_TABLET | Freq: Two times a day (BID) | ORAL | Status: DC | PRN
  Administered 2013-01-21: 500 mg via ORAL
  Filled 2013-01-19: qty 1

## 2013-01-19 MED ORDER — HYDROXYZINE HCL 25 MG PO TABS: 25.0000 mg | ORAL_TABLET | Freq: Four times a day (QID) | ORAL | Status: DC | PRN

## 2013-01-19 MED ORDER — TRAZODONE HCL 100 MG PO TABS: 100.0000 mg | ORAL_TABLET | Freq: Every evening | ORAL | Status: DC | PRN

## 2013-01-19 MED ORDER — GABAPENTIN 300 MG PO CAPS
300.0000 mg | ORAL_CAPSULE | Freq: Three times a day (TID) | ORAL | Status: DC
  Administered 2013-01-19 – 2013-01-20 (×2): 300 mg via ORAL
  Filled 2013-01-19 (×7): qty 1

## 2013-01-19 MED ORDER — CLONIDINE HCL 0.1 MG PO TABS
0.1000 mg | ORAL_TABLET | ORAL | Status: DC
  Filled 2013-01-19 (×2): qty 1

## 2013-01-19 MED ORDER — LORAZEPAM 1 MG PO TABS
2.0000 mg | ORAL_TABLET | Freq: Four times a day (QID) | ORAL | Status: DC | PRN
  Administered 2013-01-19: 2 mg via ORAL
  Filled 2013-01-19: qty 2

## 2013-01-19 MED ORDER — HYDROXYZINE HCL 25 MG PO TABS: 25.0000 mg | ORAL_TABLET | Freq: Every evening | ORAL | Status: DC | PRN

## 2013-01-19 MED ORDER — LOPERAMIDE HCL 2 MG PO CAPS: 2.0000 mg | ORAL_CAPSULE | ORAL | Status: AC | PRN

## 2013-01-19 MED ORDER — ONDANSETRON 4 MG PO TBDP
4.0000 mg | ORAL_TABLET | Freq: Four times a day (QID) | ORAL | Status: AC | PRN
  Administered 2013-01-20: 4 mg via ORAL

## 2013-01-19 MED ORDER — TRAZODONE HCL 100 MG PO TABS
100.0000 mg | ORAL_TABLET | Freq: Every evening | ORAL | Status: DC | PRN
  Administered 2013-01-19 – 2013-01-25 (×6): 100 mg via ORAL
  Filled 2013-01-19 (×4): qty 1
  Filled 2013-01-19: qty 4
  Filled 2013-01-19 (×2): qty 1

## 2013-01-19 MED ORDER — CLONIDINE HCL 0.1 MG PO TABS
0.1000 mg | ORAL_TABLET | Freq: Four times a day (QID) | ORAL | Status: DC
  Administered 2013-01-19 – 2013-01-20 (×3): 0.1 mg via ORAL
  Filled 2013-01-19 (×9): qty 1

## 2013-01-19 MED ORDER — NICOTINE 21 MG/24HR TD PT24
21.0000 mg | MEDICATED_PATCH | Freq: Every day | TRANSDERMAL | Status: DC
  Filled 2013-01-19: qty 1

## 2013-01-19 NOTE — [facility] Suicide Risk Assessment (Signed)
Suicide Risk Assessment  Admission Assessment     Nursing information obtained from:    Demographic factors:    Current Mental Status:    Loss Factors:    Historical Factors:    Risk Reduction Factors:     CLINICAL FACTORS:   Alcohol/Substance Abuse/Dependencies  COGNITIVE FEATURES THAT CONTRIBUTE TO RISK:  Closed-mindedness Thought constriction (tunnel vision)    SUICIDE RISK:   Mild:  Suicidal ideation of limited frequency, intensity, duration, and specificity.  There are no identifiable plans, no associated intent, mild dysphoria and related symptoms, good self-control (both objective and subjective assessment), few other risk factors, and identifiable protective factors, including available and accessible social support.  PLAN OF CARE: Supportive approach/coping skills/relapse prevention                              Opioid detox protocol                              Identify and address co morbidities  I certify that inpatient services furnished can reasonably be expected to improve the patient's condition.  Douglas Smolinsky A 01/19/2013, 4:18 PM

## 2013-01-19 NOTE — BH Assessment (Addendum)
Assessment Note   Lauren Lucas is a 57 y.o. female who request detox from Heroin and Methadone.  Pt denies SI/HI/Psych.  Pt reports using 2 bags of Heroin daily, last use was 2 days ago, used 1.5 bags.  Pt also admits to using Methadone when Heroin mot avail.  Pt says she consumes 10 Methadone Pills daily, last use was 2 days ago. Pt c/o w/d sxs-- nausea and "physically feeling bad".  Pt says she relapsed after being maintaining 1 year of sobriety because she stopped attending meetings and began "hanging around the wrong people".  Pt has past admissions in 2012 w/[facility], [facility], [facility] and [facility]. She endorses depressive sxs: anhedonia, decreased sleep, worthlessness.   Axis I: Opioid Dependence Axis II: Deferred Axis III:  Past Medical History  Diagnosis Date  . Mitral valve prolapse   . Heroin addiction   . Depression    Axis IV: other psychosocial or environmental problems, problems related to social environment and problems with primary support group Axis V: 41-50 serious symptoms  Past Medical History:  Past Medical History  Diagnosis Date  . Mitral valve prolapse   . Heroin addiction   . Depression     Past Surgical History  Procedure Laterality Date  . Splenectomy    . Dental surgery      Family History:  Family History  Problem Relation Age of Onset  . Hypertension Mother   . Cancer Mother   . Hypertension Father   . Heart failure Father     Social History:  reports that she has never smoked. She has never used smokeless tobacco. She reports that she uses illicit drugs. She reports that she does not drink alcohol.  Additional Social History:  Alcohol / Drug Use Pain Medications: None  Prescriptions: None  Over the Counter: None  History of alcohol / drug use?: Yes Longest period of sobriety (when/how long): Sobriety 1 yr--2013  Negative Consequences of Use: Financial;Personal relationships;Work / School Withdrawal Symptoms: Nausea / Vomiting;Weakness;Other  (Comment) ("I physically don't feel well") Substance #1 Name of Substance 1: Heroin  1 - Age of First Use: 36 YOF  1 - Amount (size/oz): 2 Bags  1 - Frequency: Daily  1 - Duration: On-going  1 - Last Use / Amount:  (2 Days Ago ) Substance #2 Name of Substance 2: Methadone  2 - Age of First Use: 37 YOF  2 - Amount (size/oz): 10 Pills  2 - Frequency: Daily  2 - Duration: On-going--"when heroin not available" 2 - Last Use / Amount: 2 Days Ago   CIWA: CIWA-Ar BP: 116/75 mmHg Pulse Rate: 58 Nausea and Vomiting: 2 COWS: Clinical Opiate Withdrawal Scale (COWS) Resting Pulse Rate: Pulse Rate 80 or below Sweating: No report of chills or flushing Restlessness: Able to sit still Pupil Size: Pupils pinned or normal size for room light Bone or Joint Aches: Mild diffuse discomfort ("physically feel bad" ) Runny Nose or Tearing: Not present GI Upset: No GI symptoms Tremor: No tremor Yawning: No yawning Anxiety or Irritability: None Gooseflesh Skin: Skin is smooth COWS Total Score: 1  Allergies:  Allergies  Allergen Reactions  . Antihistamines, Diphenhydramine-Type Hypertension  . Sulfa Antibiotics Hives    Sulfa meds, not necessarily abx    Home Medications:  (Not in a hospital admission)  OB/GYN Status:  No LMP recorded. Patient is postmenopausal.  General Assessment Data Location of Assessment: [facility] Living Arrangements: Alone Can pt return to current living arrangement?: Yes Admission Status:  Voluntary Is patient capable of signing voluntary admission?: Yes Transfer from: Acute Hospital Referral Source: MD  Education Status Is patient currently in school?: No Current Grade: None  Highest grade of school patient has completed: None  Name of school: None  Contact person: None   Risk to self Suicidal Ideation: No Suicidal Intent: No Is patient at risk for suicide?: No Suicidal Plan?: No Access to Means: No What has been your use of drugs/alcohol within the last  12 months?: Pt abusing: heroin, methadone  Previous Attempts/Gestures: No How many times?: 0 Other Self Harm Risks: None Triggers for Past Attempts: None known Intentional Self Injurious Behavior: None Family Suicide History: No Recent stressful life event(s): Other (Comment) (Relapse on Heroin ) Persecutory voices/beliefs?: No Depression: Yes Depression Symptoms: Loss of interest in usual pleasures;Feeling worthless/self pity Substance abuse history and/or treatment for substance abuse?: Yes Suicide prevention information given to non-admitted patients: Not applicable  Risk to Others Homicidal Ideation: No Thoughts of Harm to Others: No Current Homicidal Intent: No Current Homicidal Plan: No Access to Homicidal Means: No Identified Victim: None  History of harm to others?: No Assessment of Violence: None Noted Violent Behavior Description: None  Does patient have access to weapons?: No Criminal Charges Pending?: No Does patient have a court date: No  Psychosis Hallucinations: None noted Delusions: None noted  Mental Status Report Appear/Hygiene: Disheveled;Other (Comment) Volney Presser appearance ) Eye Contact: Fair Motor Activity: Unremarkable Speech: Logical/coherent Level of Consciousness: Alert Mood: Depressed Affect: Depressed Anxiety Level: None Thought Processes: Coherent;Relevant Judgement: Unimpaired Orientation: Person;Place;Time;Situation Obsessive Compulsive Thoughts/Behaviors: None  Cognitive Functioning Concentration: Normal Memory: Recent Intact;Remote Intact IQ: Average Insight: Fair Impulse Control: Fair Appetite: Fair Weight Loss: 0 Weight Gain: 0 Sleep: No Change Total Hours of Sleep: 6 Vegetative Symptoms: None  ADLScreening [facility] Assessment Services) Patient's cognitive ability adequate to safely complete daily activities?: Yes Patient able to express need for assistance with ADLs?: Yes Independently performs ADLs?: Yes (appropriate for  developmental age)  Abuse/Neglect [facility]) Physical Abuse: Denies Verbal Abuse: Denies Sexual Abuse: Denies  Prior Inpatient Therapy Prior Inpatient Therapy: Yes Prior Therapy Dates: 2012 Prior Therapy Facilty/Provider(s): [facility], [facility], [facility],  Reason for Treatment: Detox/Rehab  Prior Outpatient Therapy Prior Outpatient Therapy: No Prior Therapy Dates: None  Prior Therapy Facilty/Provider(s): None  Reason for Treatment: None   ADL Screening (condition at time of admission) Patient's cognitive ability adequate to safely complete daily activities?: Yes Patient able to express need for assistance with ADLs?: Yes Independently performs ADLs?: Yes (appropriate for developmental age) Weakness of Legs: None Weakness of Arms/Hands: None  Home Assistive Devices/Equipment Home Assistive Devices/Equipment: None  Therapy Consults (therapy consults require a physician order) PT Evaluation Needed: No OT Evalulation Needed: No SLP Evaluation Needed: No Abuse/Neglect Assessment (Assessment to be complete while patient is alone) Physical Abuse: Denies Verbal Abuse: Denies Sexual Abuse: Denies Exploitation of patient/patient's resources: Denies Self-Neglect: Denies Values / Beliefs Cultural Requests During Hospitalization: None Spiritual Requests During Hospitalization: None Consults Spiritual Care Consult Needed: No Social Work Consult Needed: No Merchant navy officer (For Healthcare) Advance Directive: Patient does not have advance directive;Patient would not like information Pre-existing out of facility DNR order (yellow form or pink MOST form): No Nutrition Screen- [facility] Patient's home diet: Regular Have you recently lost weight without trying?: No Have you been eating poorly because of a decreased appetite?: No Malnutrition Screening Tool Score: 0  Additional Information 1:1 In Past 12 Months?: No CIRT Risk: No Elopement Risk: No Does patient  have medical  clearance?: Yes     Disposition:  Disposition Disposition of Patient: Inpatient treatment program;Referred to [facility] ) Type of inpatient treatment program: Adult Patient referred to: Other (Comment) [facility] )  On Site Evaluation by:   Reviewed with Physician:     Murrell Redden 01/19/2013 3:31 AM

## 2013-01-19 NOTE — Progress Notes (Signed)
Patient ID: Lauren Lucas, female   DOB: 08/02/1956, 57 y.o.   MRN: [id number] Staff reports patient request medications for insomnia. Patient is allergic to antihistamines and has taken trazodone 100 mg as an outpatient.   PLAN: Start trazodone 100 mg QHS PRN, insomnia.

## 2013-01-19 NOTE — Tx Team (Signed)
Initial Interdisciplinary Treatment Plan  PATIENT STRENGTHS: (choose at least two) Ability for insight Active sense of humor Average or above average intelligence Capable of independent living Communication skills General fund of knowledge Motivation for treatment/growth Physical Health Religious Affiliation  PATIENT STRESSORS: Financial difficulties Substance abuse   PROBLEM LIST: Problem List/Patient Goals Date to be addressed Date deferred Reason deferred Estimated date of resolution  Detox safely off of heroin 19 Jan 2013     Regain control of depression 19 Jan 2013                                                DISCHARGE CRITERIA:  Ability to meet basic life and health needs Adequate post-discharge living arrangements Improved stabilization in mood, thinking, and/or behavior Motivation to continue treatment in a less acute level of care  PRELIMINARY DISCHARGE PLAN: Attend aftercare/continuing care group Outpatient therapy  PATIENT/FAMIILY INVOLVEMENT: This treatment plan has been presented to and reviewed with the patient, Lauren Lucas, and/or family member.  The patient and family have been given the opportunity to ask questions and make suggestions.  [name] 01/19/2013, 4:45 PM

## 2013-01-19 NOTE — ED Notes (Signed)
Report called to [facility] RN at [facility]

## 2013-01-19 NOTE — H&P (Signed)
Psychiatric Admission Assessment Adult  Patient Identification:  Lauren Lucas  Date of Evaluation:  01/19/2013  Chief Complaint:  Opioid Dependence  History of Present Illness: This is a 57 year old Caucasian female. Admitted to [facility] from the [facility] Ed with complaints of Heroin and Methadone addiction requesting detoxification treatment. Patient reports, "I was taken to the [facility] ED yesterday by a friend. I had asked for the help to get to the hospital. I needed detoxification treatment from Heroin and Methadone. I used heroin last 2 days ago. However, yesterday, I felt sick from the withdrawal symptoms. I mean, I was sick,sweating, nausea and bad anxiety. I have been using Heroin daily for the last 4 months. I use between 3 to 4 bags daily. It also dependent on the day and what was going on. I have been a heroin addict for a long time. I was clean for 12 months, then relapsed after I started to hang out again with the wrong crowd. I have lost myself, purpose in life, family, homes and jobs just because of my addiction. I use heroin because it makes me feel good. I have major depression and anxiety. I take Citalopram 20 mg daily and Trazodone 50 mg at bedtime. I have not taken these 2 medicines in a month. I finished the ones I had and did not care to refill them again. I would like to go back on them if I can. I relapsed last August. In the past, I have been to [facility], [facility] and [facility] for treatment".  Elements:  Location:  [facility] adult unit. Quality:  "I was using 3-4 bags of heroin daily for 4 months". Severity:  "I was using Methadone if I don't have or can't get heroin". Timing:  "I started using Heroin daily x 4 months ago". Duration:  "I have been an addict for a long time". Context:  "Constant cravings, Hx Larceny charge, [employer], lost home, job and family'.  Associated Signs/Synptoms:  Depression Symptoms:  depressed mood, feelings of  worthlessness/guilt, anxiety,  (Hypo) Manic Symptoms:  Impulsivity, Irritable Mood,  Anxiety Symptoms:  Excessive Worry,  Psychotic Symptoms:  Hallucinations: Denies  PTSD Symptoms: Negative  Psychiatric Specialty Exam: Physical Exam  Constitutional: She is oriented to person, place, and time. She appears well-developed.  HENT:  Head: Normocephalic.  Eyes: Pupils are equal, round, and reactive to light.  Neck: Normal range of motion.  Cardiovascular: Normal rate.   Respiratory: Effort normal.  GI: Soft.  Musculoskeletal: Normal range of motion.  Neurological: She is alert and oriented to person, place, and time.  Skin: Skin is warm and dry.  Several track marks to inner arm areas.    Review of Systems  Constitutional: Negative.   HENT: Negative.   Eyes: Negative.   Respiratory: Negative.   Cardiovascular: Negative.   Gastrointestinal: Positive for nausea.  Genitourinary: Negative.   Musculoskeletal: Negative.   Skin:       Several track marks to arm areas.  Neurological: Negative.   Endo/Heme/Allergies: Negative.   Psychiatric/Behavioral: Positive for depression and substance abuse. Negative for suicidal ideas, hallucinations and memory loss. The patient is nervous/anxious and has insomnia.     There were no vitals taken for this visit.There is no height or weight on file to calculate BMI.  General Appearance: Casual  Eye Contact::  Fair  Speech:  Clear and Coherent  Volume:  Normal  Mood:  Depressed and rated depression at #7.  Affect:  Flat  Thought Process:  Coherent and Goal Directed  Orientation:  Full (Time, Place, and Person)  Thought Content:  Rumination and Denies hallucinations.  Suicidal Thoughts:  No  Homicidal Thoughts:  No  Memory:  Immediate;   Good Recent;   Good Remote;   Good  Judgement:  Impaired  Insight:  Fair  Psychomotor Activity:  Restlessness  Concentration:  Fair  Recall:  Good  Akathisia:  No  Handed:  Right  AIMS (if  indicated):     Assets:  Desire for Improvement  Sleep:       Past Psychiatric History: Diagnosis: Opoid dependency, Major depression  Hospitalizations: [facility] x 2  Outpatient Care: [facility]  Substance Abuse Care: [facility], [facility]  Self-Mutilation: Denies  Suicidal Attempts: Denies attempts and or thoughts  Violent Behaviors: None reported   Past Medical History:   Past Medical History  Diagnosis Date  . Mitral valve prolapse   . Heroin addiction   . Depression    Cardiac History:  Mitral Valve prolapse  Allergies:   Allergies  Allergen Reactions  . Antihistamines, Diphenhydramine-Type Hypertension  . Sulfa Antibiotics Hives    Sulfa meds, not necessarily abx   PTA Medications: Prescriptions prior to admission  Medication Sig Dispense Refill  . citalopram (CELEXA) 20 MG tablet Take 20 mg by mouth every morning.      . traZODone (DESYREL) 100 MG tablet Take 100 mg by mouth at bedtime.        Previous Psychotropic Medications:  Medication/Dose  Citalopram 20 mg  Trazodone 100 mg             Substance Abuse History in the last 12 months:  yes  Consequences of Substance Abuse: Medical Consequences:  Liver damage, Possible death by overdose Legal Consequences:  Arrests, jail time, Loss of driving privilege. Family Consequences:  Family discord, divorce and or separation.  Social History:  reports that she has never smoked. She has never used smokeless tobacco. She reports that she uses illicit drugs. She reports that she does not drink alcohol. Additional Social History:  Current Place of Residence: [place], [place].   Place of Birth: [place], [place]   Family Members: "My son"  Marital Status:  Divorced  Children: 1  Sons: 1  Daughters: 0  Relationships: Divorced  Education:  [employer] Financial planner Problems/Performance: Completed high school  Religious Beliefs/Practices: None reported  History of Abuse (Emotional/Phsycial/Sexual):  Denies  Occupational Experiences: English as a second language teacher History:  None.  Legal History: Hx Larceny charge, Pharmacist, community.  Hobbies/Interests:  Family History:   Family History  Problem Relation Age of Onset  . Hypertension Mother   . Cancer Mother   . Hypertension Father   . Heart failure Father     Results for orders placed during the hospital encounter of 01/18/13 (from the past 72 hour(s))  URINALYSIS, ROUTINE W REFLEX MICROSCOPIC     Status: Abnormal   Collection Time    01/18/13  6:01 PM      Result Value Range   Color, Urine YELLOW  YELLOW   APPearance TURBID (*) CLEAR   Specific Gravity, Urine 1.015  1.005 - 1.030   pH 8.0  5.0 - 8.0   Glucose, UA NEGATIVE  NEGATIVE mg/dL   Hgb urine dipstick NEGATIVE  NEGATIVE   Bilirubin Urine NEGATIVE  NEGATIVE   Ketones, ur 15 (*) NEGATIVE mg/dL   Protein, ur NEGATIVE  NEGATIVE mg/dL   Urobilinogen, UA 1.0  0.0 - 1.0 mg/dL   Nitrite  NEGATIVE  NEGATIVE   Leukocytes, UA MODERATE (*) NEGATIVE  URINE RAPID DRUG SCREEN (HOSP PERFORMED)     Status: Abnormal   Collection Time    01/18/13  6:01 PM      Result Value Range   Opiates POSITIVE (*) NONE DETECTED   Cocaine NONE DETECTED  NONE DETECTED   Benzodiazepines NONE DETECTED  NONE DETECTED   Amphetamines NONE DETECTED  NONE DETECTED   Tetrahydrocannabinol NONE DETECTED  NONE DETECTED   Barbiturates NONE DETECTED  NONE DETECTED   Comment:            DRUG SCREEN FOR MEDICAL PURPOSES     ONLY.  IF CONFIRMATION IS NEEDED     FOR ANY PURPOSE, NOTIFY LAB     WITHIN 5 DAYS.                LOWEST DETECTABLE LIMITS     FOR URINE DRUG SCREEN     Drug Class       Cutoff (ng/mL)     Amphetamine      1000     Barbiturate      200     Benzodiazepine   200     Tricyclics       300     Opiates          300     Cocaine          300     THC              50  URINE MICROSCOPIC-ADD ON     Status: Abnormal   Collection Time    01/18/13  6:01 PM      Result Value Range   Squamous Epithelial  / LPF FEW (*) RARE   WBC, UA 7-10  <3 WBC/hpf   Bacteria, UA FEW (*) RARE  CBC WITH DIFFERENTIAL     Status: Abnormal   Collection Time    01/18/13  6:15 PM      Result Value Range   WBC 8.9  4.0 - 10.5 K/uL   RBC 4.70  3.87 - 5.11 MIL/uL   Hemoglobin 13.4  12.0 - 15.0 g/dL   HCT 16.1  09.6 - 04.5 %   MCV 82.1  78.0 - 100.0 fL   MCH 28.5  26.0 - 34.0 pg   MCHC 34.7  30.0 - 36.0 g/dL   RDW 40.9  81.1 - 91.4 %   Platelets 443 (*) 150 - 400 K/uL   Neutrophils Relative 62  43 - 77 %   Neutro Abs 5.5  1.7 - 7.7 K/uL   Lymphocytes Relative 29  12 - 46 %   Lymphs Abs 2.6  0.7 - 4.0 K/uL   Monocytes Relative 8  3 - 12 %   Monocytes Absolute 0.7  0.1 - 1.0 K/uL   Eosinophils Relative 0  0 - 5 %   Eosinophils Absolute 0.0  0.0 - 0.7 K/uL   Basophils Relative 1  0 - 1 %   Basophils Absolute 0.1  0.0 - 0.1 K/uL  COMPREHENSIVE METABOLIC PANEL     Status: Abnormal   Collection Time    01/18/13  6:15 PM      Result Value Range   Sodium 141  135 - 145 mEq/L   Potassium 3.4 (*) 3.5 - 5.1 mEq/L   Chloride 102  96 - 112 mEq/L   CO2 24  19 - 32 mEq/L   Glucose, Bld 89  70 - 99 mg/dL   BUN 6  6 - 23 mg/dL   Creatinine, Ser 4.78  0.50 - 1.10 mg/dL   Calcium 9.2  8.4 - 29.5 mg/dL   Total Protein 8.1  6.0 - 8.3 g/dL   Albumin 3.5  3.5 - 5.2 g/dL   AST 69 (*) 0 - 37 U/L   ALT 62 (*) 0 - 35 U/L   Alkaline Phosphatase 82  39 - 117 U/L   Total Bilirubin 0.3  0.3 - 1.2 mg/dL   GFR calc non Af Amer >90  >90 mL/min   GFR calc Af Amer >90  >90 mL/min   Comment:            The eGFR has been calculated     using the CKD EPI equation.     This calculation has not been     validated in all clinical     situations.     eGFR's persistently     <90 mL/min signify     possible Chronic Kidney Disease.  ETHANOL     Status: None   Collection Time    01/18/13  6:15 PM      Result Value Range   Alcohol, Ethyl (B) <11  0 - 11 mg/dL   Comment:            LOWEST DETECTABLE LIMIT FOR     SERUM ALCOHOL  IS 11 mg/dL     FOR MEDICAL PURPOSES ONLY   Psychological Evaluations:  Assessment:   AXIS I:  Opioid dependence AXIS II:  Deferred AXIS III:   Past Medical History  Diagnosis Date  . Mitral valve prolapse   . Heroin addiction   . Depression    AXIS IV:  other psychosocial or environmental problems and Substance abuse issues. AXIS V:  11-20 some danger of hurting self or others possible OR occasionally fails to maintain minimal personal hygiene OR gross impairment in communication  Treatment Plan/Recommendations: 1. Admit for crisis management and stabilization, estimated length of stay 3-5 days.  2. Medication management to reduce current symptoms to base line and improve the patient's overall level of functioning  3. Treat health problems as indicated.  4. Develop treatment plan to decrease risk of relapse upon discharge and the need for readmission.  5. Psycho-social education regarding relapse prevention and self care.  6. Health care follow up as needed for medical problems.  7. Review, reconcile, and reinstate any pertinent home medications for other health issues where appropriate. 8. Call for consults with hospitalist for any additional specialty patient care services as needed.  Treatment Plan Summary: Daily contact with patient to assess and evaluate symptoms and progress in treatment Medication management  Current Medications:  Current Facility-Administered Medications  Medication Dose Route Frequency Provider Last Rate Last Dose  . acetaminophen (TYLENOL) tablet 650 mg  650 mg Oral Q6H PRN Sanjuana Kava, NP      . alum & mag hydroxide-simeth (MAALOX/MYLANTA) 200-200-20 MG/5ML suspension 30 mL  30 mL Oral Q4H PRN Sanjuana Kava, NP      . hydrOXYzine (ATARAX/VISTARIL) tablet 25 mg  25 mg Oral QHS PRN Sanjuana Kava, NP      . magnesium hydroxide (MILK OF MAGNESIA) suspension 30 mL  30 mL Oral Daily PRN Sanjuana Kava, NP      . Melene Muller ON 01/20/2013] nicotine (NICODERM CQ  - dosed in mg/24 hours) patch 21 mg  21 mg Transdermal Q0600 Sanjuana Kava,  NP        Observation Level/Precautions:  15 minute checks  Laboratory:  Reviewed ED lab findings on file.  Psychotherapy:  Group sessions and AA/NA meetings.  Medications:  See medication lists  Consultations:  None indicated at this time.  Discharge Concerns:  Safety/sobriety  Estimated LOS: 5-7 days.  Other:     I certify that inpatient services furnished can reasonably be expected to improve the patient's condition.   Armandina Stammer I 2/13/20141:38 PM

## 2013-01-19 NOTE — [facility] Counselor (Signed)
Patient has been accepted @ [facility] by Dr. Dub Mikes to the services of Dr. Dub Mikes bed 305.2.

## 2013-01-19 NOTE — Progress Notes (Deleted)
Pt did go down to the gym for recreation time.  Pt played ping pong with multiple peers and participated in group stretch. Pt was happy to be off the hall.

## 2013-01-19 NOTE — Progress Notes (Signed)
Patient ID: Lauren Lucas, female   DOB: 1956-01-31, 57 y.o.   MRN: [id number] D:  Patient admitted from [facility] for detox from methadone and heroin.  States she had been clean for about a year and started using again in August.  Prior to admission she was injecting 2 bags per day and using methadone if heroin was not available.  Has been injecting in her right thigh and has track marks present that do not look inflamed or infected.  She states that she has good family support and a network of friends from AA/NA that are also very supportive.  Requested juice and yogurt this evening rather than dinner.  She states she has no appetite.  A:  Admission process completed.  Patient oriented to the unit and to the group schedules.  Medications were explained.  Patient was started on Clonidine protocol.  She was educated also about moving and changing position slowly as her blood pressure runs low.   R:  Patient pleasant and cooperative with the admission process.  Somewhat restless.  Tolerating medications thus far.

## 2013-01-19 NOTE — Progress Notes (Signed)
Pt did not go down to the gym for recreation time.  Pt stated " I do want to get back into activities, but not feeling up to it yet". Pt was admitted to unit just today.

## 2013-01-20 ENCOUNTER — Inpatient Hospital Stay (HOSPITAL_COMMUNITY): Admission: AD | Admit: 2013-01-20 | Payer: Medicaid Other | Source: Ambulatory Visit | Admitting: Internal Medicine

## 2013-01-20 DIAGNOSIS — F1193 Opioid use, unspecified with withdrawal: Secondary | ICD-10-CM | POA: Diagnosis present

## 2013-01-20 DIAGNOSIS — F1120 Opioid dependence, uncomplicated: Secondary | ICD-10-CM

## 2013-01-20 DIAGNOSIS — F329 Major depressive disorder, single episode, unspecified: Secondary | ICD-10-CM | POA: Diagnosis present

## 2013-01-20 DIAGNOSIS — F411 Generalized anxiety disorder: Secondary | ICD-10-CM | POA: Diagnosis present

## 2013-01-20 LAB — URINE CULTURE

## 2013-01-20 MED ORDER — CLONIDINE HCL 0.1 MG PO TABS: 0.1000 mg | ORAL_TABLET | Freq: Three times a day (TID) | ORAL | Status: DC | PRN

## 2013-01-20 MED ORDER — CIPROFLOXACIN HCL 500 MG PO TABS
500.0000 mg | ORAL_TABLET | Freq: Two times a day (BID) | ORAL | Status: DC
  Administered 2013-01-20 – 2013-01-24 (×9): 500 mg via ORAL
  Filled 2013-01-20 (×14): qty 1

## 2013-01-20 MED ORDER — ADULT MULTIVITAMIN W/MINERALS CH
1.0000 | ORAL_TABLET | Freq: Every day | ORAL | Status: DC
  Administered 2013-01-20 – 2013-01-26 (×7): 1 via ORAL
  Filled 2013-01-20 (×9): qty 1

## 2013-01-20 MED ORDER — CITALOPRAM HYDROBROMIDE 20 MG PO TABS
20.0000 mg | ORAL_TABLET | Freq: Every day | ORAL | Status: DC
  Administered 2013-01-20 – 2013-01-23 (×4): 20 mg via ORAL
  Filled 2013-01-20 (×5): qty 1

## 2013-01-20 MED ORDER — GABAPENTIN 100 MG PO CAPS
100.0000 mg | ORAL_CAPSULE | Freq: Three times a day (TID) | ORAL | Status: DC | PRN
  Administered 2013-01-24 – 2013-01-25 (×2): 100 mg via ORAL
  Filled 2013-01-20: qty 1
  Filled 2013-01-20: qty 12
  Filled 2013-01-20: qty 1

## 2013-01-20 NOTE — Progress Notes (Signed)
Pt's heart rate low this morning. Rechecked manual by [facility] and reported as 80. Scheduled 0800 Clonidine given for withdrawal. Blood pressure and pulse low at 1200. Held Clonidine and reported to NP. Educated pt about fall risks and gave Gatorade. Pt is attending group at this time. Safety maintained on the unit.

## 2013-01-20 NOTE — Progress Notes (Signed)
D.  Pt. Lying in bed in room.  B/P 103/72   Pulse 40  Respirations 18 sitting   B/P standing 106/63 Pulse 57 standing. Pt.  Reported weakness and did not feel like going to the cafeteria.  Pt. Denies SI/HI and denies A/V hallucinations. Denies pain. A.  Pt. Encouraged and given fluids.   Pt. High Fall Risk.   Charge Nurse notified of pt's vital signs status. R.  Pt. Drinking fluids given and remains safe. Marland Kitchen

## 2013-01-20 NOTE — Progress Notes (Signed)
Call to hopitialist for report of bradycardia,extreme fatigue, and abnormal labs.  Hospitalist requested provider to provider contact. [facility] NP notified.  Report to staff RN of new orders.  Patient notified of new status and instructed to inform staff RN of increasing fatigue and dizziness.

## 2013-01-20 NOTE — Progress Notes (Addendum)
Received a call from a staff RN at [facility] about this patient's bradycardia.  I advised her that the patient would likely need to be transferred from [facility] to [facility] for a cardiac evaluation, and that the patient's current MD or provider would need to arrange the transfer, and that it is our policy to only accept patient's in transfer once discussed provider to provider.  She was going to discuss this patient with the current provider and get back to me.  Please note that the patient is on clonidine which commonly causes bradycardia.    RAMA,CHRISTINA 01/20/2013 5:48 PM

## 2013-01-20 NOTE — Progress Notes (Signed)
[facility] MD Progress Note  01/20/2013 12:04 PM Lauren Lucas  MRN:  [id number] Subjective:  Lauren Lucas recognizes the symptoms of withdrawal;"feel horrible." She also endorses underlying anxiety and depression. She looking back recognizes that she associated with people she allowed to influence her in relapsing. She states that she has to avoid this people and go back to hang out with the people in the [employer] she used to hang out with. She has tried to abstain, but the withdrawal keeps her relapsing. She also recognizes she has to develop a life for herself Diagnosis:  Opioid dependence/withdrawal, Anxiety Disorder NOS,Major Depression  ADL's:  Intact  Sleep: Poor  Appetite:  Poor  Suicidal Ideation:  Plan:  Denies Intent:  Denies Means:  Denies Homicidal Ideation:  Plan:  Denies Intent:  Denies Means:  Denies AEB (as evidenced by):  Psychiatric Specialty Exam: Review of Systems  Constitutional: Positive for weight loss and malaise/fatigue.  HENT: Negative.   Eyes: Negative.   Respiratory: Negative.   Cardiovascular: Negative.   Gastrointestinal: Positive for nausea and diarrhea.  Genitourinary: Negative.   Musculoskeletal: Positive for myalgias and back pain.  Skin: Negative.   Neurological: Positive for tremors and weakness.  Endo/Heme/Allergies: Negative.   Psychiatric/Behavioral: Positive for depression and substance abuse. The patient is nervous/anxious and has insomnia.     Blood pressure 107/67, pulse 60, temperature 99.5 F (37.5 C), temperature source Oral, resp. rate 20, height 5' 0.25" (1.53 m), weight 47.174 kg (104 lb).Body mass index is 20.15 kg/(m^2).  General Appearance: Disheveled  Eye Solicitor::  Fair  Speech:  Clear and Coherent, Slow and not spontaneous  Volume:  Decreased  Mood:  Anxious, Depressed and worried  Affect:  Restricted  Thought Process:  Coherent and Goal Directed  Orientation:  Full (Time, Place, and Person)  Thought Content:   worries, concerns, somatically focused  Suicidal Thoughts:  No  Homicidal Thoughts:  No  Memory:  Immediate;   Fair Recent;   Fair Remote;   Fair  Judgement:  Fair  Insight:  Present  Psychomotor Activity:  Restlessness  Concentration:  Fair  Recall:  Fair  Akathisia:  No  Handed:  Right  AIMS (if indicated):     Assets:  Desire for Improvement  Sleep:  Number of Hours: 5   Current Medications: Current Facility-Administered Medications  Medication Dose Route Frequency Provider Last Rate Last Dose  . acetaminophen (TYLENOL) tablet 650 mg  650 mg Oral Q6H PRN Sanjuana Kava, NP      . alum & mag hydroxide-simeth (MAALOX/MYLANTA) 200-200-20 MG/5ML suspension 30 mL  30 mL Oral Q4H PRN Sanjuana Kava, NP      . ciprofloxacin (CIPRO) tablet 500 mg  500 mg Oral BID Rachael Fee, MD   500 mg at 01/20/13 0936  . citalopram (CELEXA) tablet 20 mg  20 mg Oral Daily Rachael Fee, MD   20 mg at 01/20/13 1200  . cloNIDine (CATAPRES) tablet 0.1 mg  0.1 mg Oral QID Rachael Fee, MD   0.1 mg at 01/20/13 4098   Followed by  . [START ON 01/21/2013] cloNIDine (CATAPRES) tablet 0.1 mg  0.1 mg Oral BH-qamhs Rachael Fee, MD       Followed by  . [START ON 01/24/2013] cloNIDine (CATAPRES) tablet 0.1 mg  0.1 mg Oral QAC breakfast Rachael Fee, MD      . dicyclomine (BENTYL) tablet 20 mg  20 mg Oral Q6H PRN Rachael Fee, MD      .  gabapentin (NEURONTIN) capsule 100 mg  100 mg Oral TID PRN Rachael Fee, MD      . loperamide (IMODIUM) capsule 2-4 mg  2-4 mg Oral PRN Rachael Fee, MD      . magnesium hydroxide (MILK OF MAGNESIA) suspension 30 mL  30 mL Oral Daily PRN Sanjuana Kava, NP      . methocarbamol (ROBAXIN) tablet 500 mg  500 mg Oral Q8H PRN Rachael Fee, MD      . naproxen (NAPROSYN) tablet 500 mg  500 mg Oral BID PRN Rachael Fee, MD      . ondansetron (ZOFRAN-ODT) disintegrating tablet 4 mg  4 mg Oral Q6H PRN Rachael Fee, MD   4 mg at 01/20/13 0328  . traZODone (DESYREL) tablet 100 mg  100  mg Oral QHS PRN Larena Sox, MD   100 mg at 01/19/13 2227    Lab Results:  Results for orders placed during the hospital encounter of 01/18/13 (from the past 48 hour(s))  URINALYSIS, ROUTINE W REFLEX MICROSCOPIC     Status: Abnormal   Collection Time    01/18/13  6:01 PM      Result Value Range   Color, Urine YELLOW  YELLOW   APPearance TURBID (*) CLEAR   Specific Gravity, Urine 1.015  1.005 - 1.030   pH 8.0  5.0 - 8.0   Glucose, UA NEGATIVE  NEGATIVE mg/dL   Hgb urine dipstick NEGATIVE  NEGATIVE   Bilirubin Urine NEGATIVE  NEGATIVE   Ketones, ur 15 (*) NEGATIVE mg/dL   Protein, ur NEGATIVE  NEGATIVE mg/dL   Urobilinogen, UA 1.0  0.0 - 1.0 mg/dL   Nitrite NEGATIVE  NEGATIVE   Leukocytes, UA MODERATE (*) NEGATIVE  URINE RAPID DRUG SCREEN (HOSP PERFORMED)     Status: Abnormal   Collection Time    01/18/13  6:01 PM      Result Value Range   Opiates POSITIVE (*) NONE DETECTED   Cocaine NONE DETECTED  NONE DETECTED   Benzodiazepines NONE DETECTED  NONE DETECTED   Amphetamines NONE DETECTED  NONE DETECTED   Tetrahydrocannabinol NONE DETECTED  NONE DETECTED   Barbiturates NONE DETECTED  NONE DETECTED   Comment:            DRUG SCREEN FOR MEDICAL PURPOSES     ONLY.  IF CONFIRMATION IS NEEDED     FOR ANY PURPOSE, NOTIFY LAB     WITHIN 5 DAYS.                LOWEST DETECTABLE LIMITS     FOR URINE DRUG SCREEN     Drug Class       Cutoff (ng/mL)     Amphetamine      1000     Barbiturate      200     Benzodiazepine   200     Tricyclics       300     Opiates          300     Cocaine          300     THC              50  URINE MICROSCOPIC-ADD ON     Status: Abnormal   Collection Time    01/18/13  6:01 PM      Result Value Range   Squamous Epithelial / LPF FEW (*) RARE   WBC, UA 7-10  <3  WBC/hpf   Bacteria, UA FEW (*) RARE  URINE CULTURE     Status: None   Collection Time    01/18/13  6:01 PM      Result Value Range   Specimen Description URINE, CLEAN CATCH     Special  Requests NONE     Culture  Setup Time 01/19/2013 04:01     Colony Count 9,000 COLONIES/ML     Culture INSIGNIFICANT GROWTH     Report Status 01/20/2013 FINAL    CBC WITH DIFFERENTIAL     Status: Abnormal   Collection Time    01/18/13  6:15 PM      Result Value Range   WBC 8.9  4.0 - 10.5 K/uL   RBC 4.70  3.87 - 5.11 MIL/uL   Hemoglobin 13.4  12.0 - 15.0 g/dL   HCT 47.8  29.5 - 62.1 %   MCV 82.1  78.0 - 100.0 fL   MCH 28.5  26.0 - 34.0 pg   MCHC 34.7  30.0 - 36.0 g/dL   RDW 30.8  65.7 - 84.6 %   Platelets 443 (*) 150 - 400 K/uL   Neutrophils Relative 62  43 - 77 %   Neutro Abs 5.5  1.7 - 7.7 K/uL   Lymphocytes Relative 29  12 - 46 %   Lymphs Abs 2.6  0.7 - 4.0 K/uL   Monocytes Relative 8  3 - 12 %   Monocytes Absolute 0.7  0.1 - 1.0 K/uL   Eosinophils Relative 0  0 - 5 %   Eosinophils Absolute 0.0  0.0 - 0.7 K/uL   Basophils Relative 1  0 - 1 %   Basophils Absolute 0.1  0.0 - 0.1 K/uL  COMPREHENSIVE METABOLIC PANEL     Status: Abnormal   Collection Time    01/18/13  6:15 PM      Result Value Range   Sodium 141  135 - 145 mEq/L   Potassium 3.4 (*) 3.5 - 5.1 mEq/L   Chloride 102  96 - 112 mEq/L   CO2 24  19 - 32 mEq/L   Glucose, Bld 89  70 - 99 mg/dL   BUN 6  6 - 23 mg/dL   Creatinine, Ser 9.62  0.50 - 1.10 mg/dL   Calcium 9.2  8.4 - 95.2 mg/dL   Total Protein 8.1  6.0 - 8.3 g/dL   Albumin 3.5  3.5 - 5.2 g/dL   AST 69 (*) 0 - 37 U/L   ALT 62 (*) 0 - 35 U/L   Alkaline Phosphatase 82  39 - 117 U/L   Total Bilirubin 0.3  0.3 - 1.2 mg/dL   GFR calc non Af Amer >90  >90 mL/min   GFR calc Af Amer >90  >90 mL/min   Comment:            The eGFR has been calculated     using the CKD EPI equation.     This calculation has not been     validated in all clinical     situations.     eGFR's persistently     <90 mL/min signify     possible Chronic Kidney Disease.  ETHANOL     Status: None   Collection Time    01/18/13  6:15 PM      Result Value Range   Alcohol, Ethyl (B)  <11  0 - 11 mg/dL   Comment:  LOWEST DETECTABLE LIMIT FOR     SERUM ALCOHOL IS 11 mg/dL     FOR MEDICAL PURPOSES ONLY    Physical Findings: AIMS: Facial and Oral Movements Muscles of Facial Expression: None, normal Lips and Perioral Area: None, normal Jaw: None, normal Tongue: None, normal,Extremity Movements Upper (arms, wrists, hands, fingers): None, normal Lower (legs, knees, ankles, toes): None, normal, Trunk Movements Neck, shoulders, hips: None, normal, Overall Severity Severity of abnormal movements (highest score from questions above): None, normal Incapacitation due to abnormal movements: None, normal Patient's awareness of abnormal movements (rate only patient's report): No Awareness, Dental Status Current problems with teeth and/or dentures?: No Does patient usually wear dentures?: No  CIWA:    COWS:     Treatment Plan Summary: Daily contact with patient to assess and evaluate symptoms and progress in treatment Medication management  Plan: Supportive approach/coping skills/relapse prevention           Continue Clonidine Detox Protocol           Resume Celexa 20 mg daily           She endorses that she does not like the effect on her legs that she experienced when she took the Neurontin. She asked to drop it to PRN and maybe a lower dose Medical Decision Making Problem Points:  Review of psycho-social stressors (1) Data Points:  Review of medication regiment & side effects (2) Review of new medications or change in dosage (2)  I certify that inpatient services furnished can reasonably be expected to improve the patient's condition.   Aizah Gehlhausen A 01/20/2013, 12:04 PM

## 2013-01-20 NOTE — Progress Notes (Signed)
Nutrition Brief Note  Patient identified on the Malnutrition Screening Tool (MST) Report  Body mass index is 20.15 kg/(m^2). 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  Patient appears malnourished.    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  Current diet order is regular, patient is consuming approximately 25% of meals at this time. Labs and medications reviewed.   Will order MVI daily.  Patient refuses Ensure at this time and states that she thinks that she will be able to eat well in the next day or so.   If nutrition issues arise, please consult RD.   Oran Rein, RD, LDN Clinical Inpatient Dietitian Pager:  [phone] Weekend and after hours pager:  [phone] .

## 2013-01-20 NOTE — Tx Team (Signed)
Interdisciplinary Treatment Plan Update (Adult)  Date: 01/20/2013  Time Reviewed: 9:25 AM   Progress in Treatment: Attending groups: Not as yet Participating in groups: Not as yet Taking medication as prescribed:  Yes Tolerating medication:  Yes Family/Significant othe contact made: Not as yet Patient understands diagnosis: Yes Discussing patient identified problems/goals with staff: Yes Medical problems stabilized or resolved:  Yes Denies suicidal/homicidal ideation: Yes Issues/concerns per patient self-inventory:  Inventory not available during treatment team Other: N/A  New problem(s) identified: None Identified  Reason for Continuation of Hospitalization: Medication stabilization Withdrawal symptoms Depression  Interventions implemented related to continuation of hospitalization: mood stabilization, medication monitoring and adjustment, group therapy and psycho education, suicide risk assessment, collateral contact, aftercare planning, ongoing physician assessments and safety checks q 15 mins  Additional comments: N/A  Estimated length of stay: 5 days  Discharge Plan:  CSW is assessing for appropriate referrals.   New goal(s): N/A  Review of initial/current patient goals per problem list:     1.  Goal: Patient will be able to identify effective and ineffective coping patterns  Met: No  Target Date:  As evidenced by:  2. Goal (s): Reduce depressive symptoms from a 10 to a 3  Met: No  Target date: 5 days  As evidenced by: Pt rates at a 6 today 3. Complete Detox Protocol and Identify comprehensive mental wellness and sobriety plan  Met: No  Target date: 5 days As evidenced [id number] report, with support of CSW    Attendees: Patient:     Family:     Physician:  Geoffery Lyons 01/20/2013  10:07 AM   Nursing:   Waynetta Sandy, RN 01/20/2013  10:07 AM   Clinical Social Worker Catherine Harrill 01/20/2013  10:07 AM   Other:   01/20/2013  10:07 AM   Other:  Serena Colonel, PA  01/20/2013  10.07 AM   Other:   01/20/2013  10:07 AM   Other:   01/20/2013  10:07 AM    Scribe for Treatment Team:   Carney Bern, LCSWA  01/20/2013 10:07 AM

## 2013-01-20 NOTE — Progress Notes (Signed)
D.Pt.'s B/P 110/60  Pulse 48 standing   B/P 120/63 Pulse 44 sitting.  Respirations 16. Pt. Reports weakness.    Apical pulse manually 44.  Pt. Denies pain.  Deirdre Peer NP notified. No EKG or new orders given.  R.  Fluids given.  Will continue to assess and monitor for safety.

## 2013-01-20 NOTE — Progress Notes (Signed)
Patient ID: Lauren Lucas, female   DOB: 05/30/1956, 56 y.o.   MRN: [id number] D: Pt. Lying in bed, reports frustration.  Depression "5" of 10. A: Writer introduced self to client and encouraged her to report concerns. Staff will monitor q63min for safety. R: Pt. Is safe on the unit. Pt. Did not go to recreation group.

## 2013-01-20 NOTE — Progress Notes (Signed)
[facility] Group Notes: (Counselor/Nursing/MHT/Case Management/Adjunct)  01/20/2013 1:15   Type of Therapy: Group Therapy at 1:15 to 2:30   Participation Level: Arrived late yet Active once she attended  Participation Quality: Appropriate, Attentive and Sharing   Affect: Appropriate   Cognitive: Appropriate   Insight: Developing/Improving   Engagement in Group: Engaged   Engagement in Therapy: Engaged   Modes of Intervention: Discussion, Education, Armed forces technical officer of Progress/Problems: Group session included an educational portion on Post Acute Withdrawal Syndrome (PAWS) and a processing portion on feelings about relapse and what, if anything, is the motive for recovery.  Lauren Lucas shared that she has experienced PAWS before as she has had extended periods of clean time.  Lauren Lucas shared her process of using affirmations in early recovery with the group, many of whom were very interested. Lauren Lucas was able to process her "motivation for recovery is overall well being, when I use my world just gets smaller and smaller."  [name]  01/20/2013 5:31 PM

## 2013-01-20 NOTE — Progress Notes (Signed)
Adult Psychoeducational Group Note  Date:  01/20/2013 Time:  11:00  Group Topic/Focus:  Relapse Prevention Planning:   The focus of this group is to define relapse and discuss the need for planning to combat relapse.  Participation Level:  Active  Participation Quality:  Appropriate, Sharing and Supportive  Affect:  Appropriate  Cognitive:  Appropriate  Insight: Appropriate  Engagement in Group:  Engaged and Supportive  Modes of Intervention:  Discussion, Education and Support  Additional Comments: pt discussed ways to prevent relapses.   Braxtin Bamba M 01/20/2013, 6:59 PM 

## 2013-01-21 DIAGNOSIS — F19939 Other psychoactive substance use, unspecified with withdrawal, unspecified: Principal | ICD-10-CM

## 2013-01-21 MED ORDER — IBUPROFEN 200 MG PO TABS
400.0000 mg | ORAL_TABLET | Freq: Four times a day (QID) | ORAL | Status: DC | PRN
  Administered 2013-01-25: 400 mg via ORAL
  Filled 2013-01-21: qty 2

## 2013-01-21 NOTE — Progress Notes (Signed)
Adult Psychoeducational Group Note  Date:  01/21/2013 Time:  1330  Group Topic/Focus:  Building Self Esteem:   The Focus of this group is helping patients become aware of the effects of self-esteem on their lives, the things they and others do that enhance or undermine their self-esteem, seeing the relationship between their level of self-esteem and the choices they make and learning ways to enhance self-esteem.  Participation Level:  Active  Participation Quality:  Appropriate, Intrusive, Sharing and Supportive  Affect:  Anxious and Appropriate  Cognitive:  Alert, Appropriate and Oriented  Insight: Appropriate and Improving  Engagement in Group:  Engaged and Improving  Modes of Intervention:  Activity, Clarification, Discussion, Exploration and Problem-solving  Additional Comments:  Discussed personal issues she has w/self esteem w/peers and insight  Lauren Lucas 01/21/2013, 2:23 PM

## 2013-01-21 NOTE — Progress Notes (Signed)
D did not sleep at all last nite d/t not being to take Trazadone and pulse being so low, appetite is poor d/t being tired and needing rest, energy level is low and ability to pay attention is poor at this time depressed 3/10 and hopeless 10/10, denies Si or Hi or AVH, WD s/s continue w/cravings, agitation and lightheadedness, attending DR for meals and attending group on the unit, interacting w/peers in the dayroom A q50min safety checks continue and support offered, encouraged to continue attending group R patient remains safe on the unit

## 2013-01-21 NOTE — Progress Notes (Signed)
Adult Psychoeducational Group Note  Date:  01/21/2013 Time:  0900  Group Topic/Focus:  Self Inventory Review  Participation Level:  Did Not Attend     Additional Comments:  Pt refused to attend group.  Roslynn Holte Shari Prows 01/21/2013, 11:52 AM

## 2013-01-21 NOTE — Progress Notes (Signed)
Patient ID: Lauren Lucas, female   DOB: Jun 04, 1956, 57 y.o.   MRN: [id number] D: Patient lying in bed with eyes closed. Respirations even and non-labored.  A: Staff will monitor on q 15 minute checks, follow treatment plan, and give meds as ordered. R: No response from patient due to sleeping at this time.

## 2013-01-21 NOTE — Progress Notes (Signed)
Patient ID: Lauren Lucas, female   DOB: 11-29-1956, 57 y.o.   MRN: [id number] [facility] MD Progress Note  01/21/2013 5:39 PM [name]  MRN:  [id number] Subjective: "I feel so much better today than yesterday."  My pulse was too low with the clonidine so they had to stop it. Didn't sleep well at all. Got a pretty good headache, a little agitation, some irritability which is really not me."  Diagnosis:  Opioid dependence/withdrawal, Anxiety Disorder NOS,Major Depression  ADL's:  Intact  Sleep: Poor  Appetite:  Poor  Suicidal Ideation:  Plan:  Denies Intent:  Denies Means:  Denies Homicidal Ideation:  Plan:  Denies Intent:  Denies Means:  Denies AEB (as evidenced by): Patient's report of decreased symptoms, affect, and participation in unit programming.  Psychiatric Specialty Exam: Review of Systems  Constitutional: Positive for malaise/fatigue.  Eyes: Negative.   Respiratory: Negative.   Cardiovascular: Negative.   Genitourinary: Negative.   Skin: Negative.   Neurological: Positive for tremors and headaches.  Endo/Heme/Allergies: Negative.   Psychiatric/Behavioral: Positive for depression and substance abuse. The patient is nervous/anxious and has insomnia.     Blood pressure 99/70, pulse 66, temperature 99.2 F (37.3 C), temperature source Oral, resp. rate 16, height 5' 0.25" (1.53 m), weight 47.174 kg (104 lb), SpO2 99.00%.Body mass index is 20.15 kg/(m^2).  General Appearance: Disheveled  Eye Contact::  good  Speech:  Normal rate and rhythm   Volume:  Decreased  Mood:  Anxious, Depressed and worried  Affect:  Restricted  Thought Process:  Coherent and Goal Directed  Orientation:  Full (Time, Place, and Person)  Thought Content:  worries, concerns, somatically focused  Suicidal Thoughts:  No  Homicidal Thoughts:  No  Memory:  Immediate;   Fair Recent;   Fair Remote;   Fair  Judgement:  Fair  Insight:  Present  Psychomotor Activity:  Restlessness   Concentration:  Fair  Recall:  Fair  Akathisia:  No  Handed:  Right  AIMS (if indicated):     Assets:  Desire for Improvement  Sleep:  Number of Hours: 5   Current Medications: Current Facility-Administered Medications  Medication Dose Route Frequency Provider Last Rate Last Dose  . acetaminophen (TYLENOL) tablet 650 mg  650 mg Oral Q6H PRN Sanjuana Kava, NP      . alum & mag hydroxide-simeth (MAALOX/MYLANTA) 200-200-20 MG/5ML suspension 30 mL  30 mL Oral Q4H PRN Sanjuana Kava, NP      . ciprofloxacin (CIPRO) tablet 500 mg  500 mg Oral BID Sanjuana Kava, NP   500 mg at 01/21/13 0865  . citalopram (CELEXA) tablet 20 mg  20 mg Oral Daily Rachael Fee, MD   20 mg at 01/21/13 7846  . dicyclomine (BENTYL) tablet 20 mg  20 mg Oral Q6H PRN Rachael Fee, MD      . gabapentin (NEURONTIN) capsule 100 mg  100 mg Oral TID PRN Rachael Fee, MD      . loperamide (IMODIUM) capsule 2-4 mg  2-4 mg Oral PRN Rachael Fee, MD      . magnesium hydroxide (MILK OF MAGNESIA) suspension 30 mL  30 mL Oral Daily PRN Sanjuana Kava, NP      . methocarbamol (ROBAXIN) tablet 500 mg  500 mg Oral Q8H PRN Rachael Fee, MD      . multivitamin with minerals tablet 1 tablet  1 tablet Oral Daily Jeoffrey Massed, RD   1 tablet at  01/21/13 0821  . naproxen (NAPROSYN) tablet 500 mg  500 mg Oral BID PRN Rachael Fee, MD   500 mg at 01/21/13 1409  . ondansetron (ZOFRAN-ODT) disintegrating tablet 4 mg  4 mg Oral Q6H PRN Rachael Fee, MD   4 mg at 01/20/13 0328  . traZODone (DESYREL) tablet 100 mg  100 mg Oral QHS PRN Larena Sox, MD   100 mg at 01/19/13 2227    Lab Results:  No results found for this or any previous visit (from the past 48 hour(s)).  Physical Findings: AIMS: Facial and Oral Movements Muscles of Facial Expression: None, normal Lips and Perioral Area: None, normal Jaw: None, normal Tongue: None, normal,Extremity Movements Upper (arms, wrists, hands, fingers): None, normal Lower (legs, knees,  ankles, toes): None, normal, Trunk Movements Neck, shoulders, hips: None, normal, Overall Severity Severity of abnormal movements (highest score from questions above): None, normal Incapacitation due to abnormal movements: None, normal Patient's awareness of abnormal movements (rate only patient's report): No Awareness, Dental Status Current problems with teeth and/or dentures?: No Does patient usually wear dentures?: No  CIWA:  CIWA-Ar Total: 3 COWS:     Treatment Plan Summary: Daily contact with patient to assess and evaluate symptoms and progress in treatment Medication management  Plan: Supportive approach/coping skills/relapse prevention           Continue Clonidine Detox Protocol           Resume Celexa 20 mg daily           She endorses that she does not like the effect on her legs that she experienced when she took the Neurontin. She asked to drop it to PRN and maybe a lower dose           1Will add Ibuprofen for the patient at her request states it works for her better than the Naproxyn. Medical Decision Making Problem Points:  Review of psycho-social stressors (1) Data Points:  Review of medication regiment & side effects (2) Review of new medications or change in dosage (2)  I certify that inpatient services furnished can reasonably be expected to improve the patient's condition.  Rona Ravens. Damaso Laday RPAC 01/21/2013, 5:39 PM

## 2013-01-21 NOTE — Clinical Social Work Note (Signed)
[facility] Group Notes:  (Clinical Social Work)  01/21/2013     10-11AM  Summary of Progress/Problems:   The main focus of today's process group was for the patient to identify ways in which they have in the past sabotaged their own recovery. Motivational Interviewing was utilized to ask the group members what they get out of their substance use, and what they want to change.  The Stages of Change were explained, and members identified where they currently are with regard to stages of change.  The patient expressed that she had not been using alcohol, but was still using drugs.  She went to stay with a friend who was drinking, because the patient thought she could "just control my drinking."  She stated she drinks for an escape from her problems.  She admits that the last time she was here at [facility], she was very against the meetings, and now she realizes that while she was using, she missed the camaraderie that goes along with having a home group.  She feels she is in the preparation stage.  Type of Therapy:  Group Therapy - Process   Participation Level:  Active  Participation Quality:  Attentive, Sharing and Supportive  Affect:  Blunted and Depressed  Cognitive:  Appropriate and Oriented  Insight:  Engaged  Engagement in Therapy:  Engaged  Modes of Intervention:  Education, Teacher, English as a foreign language, Exploration, Discussion, Motivational Interviewing   Ambrose Mantle, LCSW 01/21/2013, 1:09 PM

## 2013-01-21 NOTE — Progress Notes (Signed)
Pt stated earlier she had a really bad headache . After drinking 3 cups of gatorade she states she felt much better. Pt did attend some groups and does contract for safety. No SI or HI and does appear to keep to herself a lot but is in the hallway with some of the other pts. Pt does deny SI or HI and does contract for safety.

## 2013-01-21 NOTE — Progress Notes (Signed)
D.  Pt pleasant on approach, bright affect.  Denies complaints other than insomnia at this time.  Positive for evening AA group.  Denies SI/HI/hallucinations at this time.  Denies s/s of withdrawal.  A.  Support and encouragement offered  R.  Pt remains safe on unit

## 2013-01-22 MED ORDER — HYDROCORTISONE 1 % EX CREA
TOPICAL_CREAM | Freq: Four times a day (QID) | CUTANEOUS | Status: DC
  Administered 2013-01-22: 19:00:00 via TOPICAL
  Administered 2013-01-23: 1 via TOPICAL
  Administered 2013-01-24: 17:00:00 via TOPICAL
  Administered 2013-01-25: 1 via TOPICAL
  Filled 2013-01-22 (×3): qty 28

## 2013-01-22 NOTE — Progress Notes (Signed)
Patient did attend the evening speaker AA meeting.  

## 2013-01-22 NOTE — Progress Notes (Signed)
[facility] MD Progress Note  01/22/2013 10:19 AM Lauren Lucas  MRN:  [id number] Subjective: "I am not sleeping well at all."  Objective: Discussed the use of SAMe for sleep after she leaves.  Diagnosis:  Opioid dependence/withdrawal, Anxiety Disorder NOS,Major Depression  ADL's:  Intact  Sleep: Poor  Appetite:  Poor  Suicidal Ideation:  Plan:  Denies Intent:  Denies Means:  Denies Homicidal Ideation:  Plan:  Denies Intent:  Denies Means:  Denies AEB (as evidenced by): Patient's report of decreased symptoms, affect, and participation in unit programming.  Psychiatric Specialty Exam: Review of Systems  Constitutional: Positive for malaise/fatigue.  Eyes: Negative.   Respiratory: Negative.   Cardiovascular: Negative.   Genitourinary: Negative.   Skin: Negative.   Neurological: Positive for tremors and headaches.  Endo/Heme/Allergies: Negative.   Psychiatric/Behavioral: Positive for depression and substance abuse. The patient is nervous/anxious and has insomnia.     Blood pressure 110/66, pulse 69, temperature 99.6 F (37.6 C), temperature source Oral, resp. rate 14, height 5' 0.25" (1.53 m), weight 47.174 kg (104 lb), SpO2 99.00%.Body mass index is 20.15 kg/(m^2).  General Appearance: Disheveled  Eye Contact::  good  Speech:  Normal rate and rhythm   Volume:  Decreased  Mood:  Anxious, Depressed and worried  Affect:  Restricted  Thought Process:  Coherent and Goal Directed  Orientation:  Full (Time, Place, and Person)  Thought Content:  worries, concerns, somatically focused  Suicidal Thoughts:  No  Homicidal Thoughts:  No  Memory:  Immediate;   Fair Recent;   Fair Remote;   Fair  Judgement:  Fair  Insight:  Present  Psychomotor Activity:  Restlessness  Concentration:  Fair  Recall:  Fair  Akathisia:  No  Handed:  Right  AIMS (if indicated):     Assets:  Desire for Improvement  Sleep:  Number of Hours: 4   Current Medications: Current Facility-Administered  Medications  Medication Dose Route Frequency Provider Last Rate Last Dose  . acetaminophen (TYLENOL) tablet 650 mg  650 mg Oral Q6H PRN Sanjuana Kava, NP      . alum & mag hydroxide-simeth (MAALOX/MYLANTA) 200-200-20 MG/5ML suspension 30 mL  30 mL Oral Q4H PRN Sanjuana Kava, NP      . ciprofloxacin (CIPRO) tablet 500 mg  500 mg Oral BID Sanjuana Kava, NP   500 mg at 01/22/13 0835  . citalopram (CELEXA) tablet 20 mg  20 mg Oral Daily Rachael Fee, MD   20 mg at 01/22/13 0835  . dicyclomine (BENTYL) tablet 20 mg  20 mg Oral Q6H PRN Rachael Fee, MD      . gabapentin (NEURONTIN) capsule 100 mg  100 mg Oral TID PRN Rachael Fee, MD      . ibuprofen (ADVIL,MOTRIN) tablet 400 mg  400 mg Oral Q6H PRN Verne Spurr, PA-C      . loperamide (IMODIUM) capsule 2-4 mg  2-4 mg Oral PRN Rachael Fee, MD      . magnesium hydroxide (MILK OF MAGNESIA) suspension 30 mL  30 mL Oral Daily PRN Sanjuana Kava, NP      . methocarbamol (ROBAXIN) tablet 500 mg  500 mg Oral Q8H PRN Rachael Fee, MD      . multivitamin with minerals tablet 1 tablet  1 tablet Oral Daily Jeoffrey Massed, RD   1 tablet at 01/22/13 [phone]  . ondansetron (ZOFRAN-ODT) disintegrating tablet 4 mg  4 mg Oral Q6H PRN Rachael Fee, MD  4 mg at 01/20/13 0328  . traZODone (DESYREL) tablet 100 mg  100 mg Oral QHS PRN Larena Sox, MD   100 mg at 01/21/13 2202    Lab Results:  No results found for this or any previous visit (from the past 48 hour(s)).  Physical Findings: AIMS: Facial and Oral Movements Muscles of Facial Expression: None, normal Lips and Perioral Area: None, normal Jaw: None, normal Tongue: None, normal,Extremity Movements Upper (arms, wrists, hands, fingers): None, normal Lower (legs, knees, ankles, toes): None, normal, Trunk Movements Neck, shoulders, hips: None, normal, Overall Severity Severity of abnormal movements (highest score from questions above): None, normal Incapacitation due to abnormal movements: None,  normal Patient's awareness of abnormal movements (rate only patient's report): No Awareness, Dental Status Current problems with teeth and/or dentures?: No Does patient usually wear dentures?: No  CIWA:  CIWA-Ar Total: 0 COWS:  COWS Total Score: 0  Treatment Plan Summary: Daily contact with patient to assess and evaluate symptoms and progress in treatment Medication management  Plan: Supportive approach/coping skills/relapse prevention           Continue Clonidine Detox Protocol           Continue Celexa 20 mg daily           Continue Ibuprofen for the patient at her request states it works for her better than the Naproxyn. Medical Decision Making Problem Points:  Review of psycho-social stressors (1) Data Points:  Review of medication regiment & side effects (2)  I certify that inpatient services furnished can reasonably be expected to improve the patient's condition.  Rona Ravens. Mashburn RPAC 01/22/2013, 10:19 AM

## 2013-01-22 NOTE — Progress Notes (Signed)
[facility] Group Notes:  (Nursing/MHT/Case Management/Adjunct)  Date:  01/22/2013  Time:  6:16 AM  Type of Therapy:  AA  Participation Level:  Active  Participation Quality:  Appropriate  Affect:  Appropriate  Cognitive:  Appropriate  Insight:  Appropriate  Engagement in Group:  Engaged  Modes of Intervention:  Discussion and Support  Summary of Progress/Problems: Patient remained in group the entire meeting.  Tomi Bamberger Coursey 01/22/2013, 6:16 AM

## 2013-01-22 NOTE — Progress Notes (Signed)
D Pt is OOB UAL on the hall today...tolerated well. Her affect is bright, she is pleasant and cooperative and she quietly jokes with this nurse. SHe makes good eye contact.. She is engaged in her recovery and attends her groups.    A She completes her AM self inventory and on it she writes she denies SI within the past 24 hrs, she rated her feelings of depression and hopelessness " 3 / 10 " and states her DC plan is to " stay away from old people, places and things".    R Safety is in place and POC cont.

## 2013-01-22 NOTE — Clinical Social Work Note (Signed)
[facility] Group Notes:  (Clinical Social Work)  01/22/2013  10:00-11:00AM  Summary of Progress/Problems:   The main focus of today's process group was to define "support" and describe what healthy supports are, then to identify the patient's current support system and decide on other supports that can be put in place to prevent future hospitalizations.   Handouts were used, and a lengthy, lively discussion was held about looking at things from several perspectives.  The patient expressed complete understanding of the topic and a willingness to grow her support system.  She talked at length about the difference of the different kinds of support, and how she uses and relies on her son's support despite the fact that he refuses to enable her, has even let her live on the street.  Type of Therapy:  Process Group with Motivational Interviewing  Participation Level:  Active  Participation Quality:  Attentive, Sharing and Supportive  Affect:  Blunted  Cognitive:  Oriented  Insight:  Engaged  Engagement in Therapy:  Engaged  Modes of Intervention:  Clarification, Education, Limit-setting, Problem-solving, Socialization, Support and Processing, Exploration, Discussion, Role-Play   Ambrose Mantle, LCSW 01/22/2013, 11:16 AM

## 2013-01-23 DIAGNOSIS — F411 Generalized anxiety disorder: Secondary | ICD-10-CM

## 2013-01-23 MED ORDER — ENSURE PUDDING PO PUDG
1.0000 | Freq: Three times a day (TID) | ORAL | Status: DC
  Administered 2013-01-23: 1 via ORAL
  Filled 2013-01-23 (×16): qty 1

## 2013-01-23 MED ORDER — CITALOPRAM HYDROBROMIDE 10 MG PO TABS
30.0000 mg | ORAL_TABLET | Freq: Every day | ORAL | Status: DC
  Administered 2013-01-24 – 2013-01-26 (×3): 30 mg via ORAL
  Filled 2013-01-23 (×6): qty 3

## 2013-01-23 NOTE — Progress Notes (Signed)
[facility] MD Progress Note  01/23/2013 5:09 PM Lauren Lucas  MRN:  [id number] Subjective:  Lauren Lucas endorses that she still feels weak. She is trying to eat but she has to force the food down. Still gets nauseated. Still feels down. Upset with herself that she allowed things to get to this point. States that she knows what to do. Knows that she has to avoid some people and have to get back to her AA support group.  Diagnosis:  Opioid dependence/withdrawal, Major Depression, GAD  ADL's:  Intact  Sleep: Poor  Appetite:  Poor  Suicidal Ideation:  Plan:  Denies Intent:  Denies Means:  Denies Homicidal Ideation:  Plan:  Denies Intent:  Denies Means:  Denies AEB (as evidenced by):  Psychiatric Specialty Exam: Review of Systems  Constitutional: Positive for weight loss and malaise/fatigue.  HENT: Negative.   Eyes: Negative.   Respiratory: Negative.   Cardiovascular: Negative.   Gastrointestinal: Negative.   Genitourinary: Negative.   Musculoskeletal: Positive for myalgias.  Skin: Negative.   Neurological: Positive for weakness.  Endo/Heme/Allergies: Negative.   Psychiatric/Behavioral: Positive for depression and substance abuse. The patient is nervous/anxious and has insomnia.     Blood pressure 146/92, pulse 54, temperature 98.6 F (37 C), temperature source Oral, resp. rate 16, height 5' 0.25" (1.53 m), weight 47.174 kg (104 lb), SpO2 99.00%.Body mass index is 20.15 kg/(m^2).  General Appearance: Disheveled  Eye Solicitor::  Fair  Speech:  Clear and Coherent and Slow  Volume:  Decreased  Mood:  Anxious, Depressed and worried  Affect:  Restricted  Thought Process:  Coherent and Goal Directed  Orientation:  Full (Time, Place, and Person)  Thought Content:  WDL, Rumination and worries, concerns  Suicidal Thoughts:  No  Homicidal Thoughts:  No  Memory:  Immediate;   Fair Recent;   Fair Remote;   Fair  Judgement:  Fair  Insight:  Present  Psychomotor Activity:   Restlessness  Concentration:  Fair  Recall:  Fair  Akathisia:  No  Handed:  Right  AIMS (if indicated):     Assets:  Desire for Improvement  Sleep:  Number of Hours: 6.75   Current Medications: Current Facility-Administered Medications  Medication Dose Route Frequency Provider Last Rate Last Dose  . acetaminophen (TYLENOL) tablet 650 mg  650 mg Oral Q6H PRN Sanjuana Kava, NP      . alum & mag hydroxide-simeth (MAALOX/MYLANTA) 200-200-20 MG/5ML suspension 30 mL  30 mL Oral Q4H PRN Sanjuana Kava, NP      . ciprofloxacin (CIPRO) tablet 500 mg  500 mg Oral BID Sanjuana Kava, NP   500 mg at 01/23/13 0755  . [START ON 01/24/2013] citalopram (CELEXA) tablet 30 mg  30 mg Oral Daily Rachael Fee, MD      . dicyclomine (BENTYL) tablet 20 mg  20 mg Oral Q6H PRN Rachael Fee, MD      . feeding supplement (ENSURE) pudding 1 Container  1 Container Oral TID BM Rachael Fee, MD   1 Container at 01/23/13 1516  . gabapentin (NEURONTIN) capsule 100 mg  100 mg Oral TID PRN Rachael Fee, MD      . hydrocortisone cream 1 %   Topical QID Verne Spurr, PA-C   1 application at 01/23/13 0754  . ibuprofen (ADVIL,MOTRIN) tablet 400 mg  400 mg Oral Q6H PRN Verne Spurr, PA-C      . loperamide (IMODIUM) capsule 2-4 mg  2-4 mg Oral PRN Reymundo Poll  Dub Mikes, MD      . magnesium hydroxide (MILK OF MAGNESIA) suspension 30 mL  30 mL Oral Daily PRN Sanjuana Kava, NP      . methocarbamol (ROBAXIN) tablet 500 mg  500 mg Oral Q8H PRN Rachael Fee, MD   500 mg at 01/22/13 2149  . multivitamin with minerals tablet 1 tablet  1 tablet Oral Daily Jeoffrey Massed, RD   1 tablet at 01/23/13 0755  . ondansetron (ZOFRAN-ODT) disintegrating tablet 4 mg  4 mg Oral Q6H PRN Rachael Fee, MD   4 mg at 01/20/13 0328  . traZODone (DESYREL) tablet 100 mg  100 mg Oral QHS PRN Larena Sox, MD   100 mg at 01/22/13 2150    Lab Results: No results found for this or any previous visit (from the past 48 hour(s)).  Physical Findings: AIMS:  Facial and Oral Movements Muscles of Facial Expression: None, normal Lips and Perioral Area: None, normal Jaw: None, normal Tongue: None, normal,Extremity Movements Upper (arms, wrists, hands, fingers): None, normal Lower (legs, knees, ankles, toes): None, normal, Trunk Movements Neck, shoulders, hips: None, normal, Overall Severity Severity of abnormal movements (highest score from questions above): None, normal Incapacitation due to abnormal movements: None, normal Patient's awareness of abnormal movements (rate only patient's report): No Awareness, Dental Status Current problems with teeth and/or dentures?: No Does patient usually wear dentures?: No  CIWA:  CIWA-Ar Total: 2 COWS:  COWS Total Score: 2  Treatment Plan Summary: Daily contact with patient to assess and evaluate symptoms and progress in treatment Medication management  Plan: Supportive approach/coping skills/relapse prevention           Continue detox           Increase the Celexa to 30 mg daily           Ensure (pudding Form)  Medical Decision Making Problem Points:  Review of psycho-social stressors (1) Data Points:  Review of medication regiment & side effects (2)  I certify that inpatient services furnished can reasonably be expected to improve the patient's condition.   Lauren Lucas A 01/23/2013, 5:09 PM

## 2013-01-23 NOTE — Progress Notes (Signed)
Pt reports she is doing better this evening.  She says she was able to eat some at dinner tonight.  She doesn't want to eat the pudding, saying it is not very good.  She doesn't c/o any pain related to the UTI for which she is taking Cipro.  She says she is having minimal withdrawal symptoms at this time.  She denies SI/HI/AV.  She feels that once she has been detoxed, she will be able to stay clean if she stays away from the wrong people.  She has been attending groups and participating on the unit.  Support/encouragement given.  Pt makes her needs known to staff.  Safety maintained with q15 minute checks.

## 2013-01-23 NOTE — Progress Notes (Signed)
Adult Psychoeducational Group Note  Date:  01/23/2013 Time:  2000 Group Topic/Focus:  AA group  Participation Level:  Active  Participation Quality:  Appropriate and Attentive  Affect:  Appropriate  Cognitive:  Alert and Appropriate  Insight: Appropriate  Engagement in Group:  Engaged  Modes of Intervention:  Support  Additional Comments:    Humberto Seals Monique 01/23/2013, 11:11 PM

## 2013-01-23 NOTE — Progress Notes (Signed)
Patient ID: Lauren Lucas, female   DOB: 05/24/1956, 57 y.o.   MRN: [id number] D- Patient reports sleep with medication.  Her appetite is improving.  Says she was able to eat some at lunch today.  Her energy is low and her ability to pay attention is improving.  She rates her depression a 2 and her hoplessness a10.  She denies thoughts of self harm and she denies pain. Patient reported two loose BM's this am and that her stomach was churning. A- Asked pt to report further BM''s .  R- Patient happy to report no further problem with this.  She has been programming on the 300 hall.

## 2013-01-23 NOTE — Progress Notes (Signed)
[facility] LCSW Aftercare Discharge Planning Group Note  01/23/2013  Participation Quality:  Did not attend    [name] 01/23/2013

## 2013-01-23 NOTE — Progress Notes (Signed)
[facility] LCSW Group Therapy  01/23/2013   Type of Therapy: Group Therapy 1:15 to 2:30  Participation Level: Active  Participation Quality:  Appropriate  Affect:  Appropriate  Cognitive: Appropriate  Insight: Developing/Improving  Engagement in Therapy:  Appropritate  Modes of Intervention:  Exploration, Clarification, problem solving and reality testing  Summary of Progress/Problems:   Group discussion focused on what patient's see as their own obstacles to recovery.  Patient shared belief that her seasonal depression and tendency to isolate will be difficult to deal with.  Patient encouraged by others in group to  Attend the group session tomorrow by [facility] and get involved in some of the programs there.  Patient disclosed she has tendency to isolate and never has guests in her home.  Beth shared that she "prefers to live alone due to all the time I have roommates in treatment."  Patient is open to to 'considering a roommate' and even shared that her sponsor and home group have suggested same.   [name] 01/23/2013

## 2013-01-24 NOTE — Progress Notes (Signed)
[facility] Group Notes:  (Nursing/MHT/Case Management/Adjunct)  Type of Therapy:  Psychoeducational Skills  Participation Level:  Active  Participation Quality:  Appropriate, Attentive, Sharing and Supportive  Affect:  Appropriate  Cognitive:  Alert, Appropriate and Oriented  Insight:  Good  Engagement in Group:  Engaged  Modes of Intervention:  Activity, Discussion, Education, Problem-solving, Rapport Building, Socialization and Support  Summary of Progress/Problems: Lanora Manis attended psychoeducational group on labels. 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 Itzayanna was active and insightful while group discussed what labels are, how we use them, how they affect the way we think about and perceive the world, and listed positive and negative labels they have used or been called. Kaydi stated that a negtive label she has dealt with is "Karie Mainland' and that this causes her a lot of shame but was able to discuss how being in non-linear recovery at this time was a positive for her to be proud of. Helyne was given homework assignment to list 10 words she has been labeled and to find the reality of the situation/label.    Wandra Scot 01/24/2013 6:17 PM

## 2013-01-24 NOTE — Progress Notes (Signed)
[facility] LCSW Aftercare Discharge Planning Group Note  01/24/2013   Participation Quality:  Appropriate  Affect:  Depressed  Cognitive:  Alert and Oriented  Insight:  Developing/Improving  Engagement in Group:  Engaged  Modes of Intervention:  Clarification, Exploration, Rapport Building and Support  Summary of Progress/Problems: Pt denies both suicidal ideation and homicidal ideation.  On a scale of 1 to 10 with ten being the most ever experienced, the patient rates depression at a 5 and anxiety at a 2.  Patient is considering the option of inviting a friend to live with her for a few months in order to have some accountability.  Patient is also willing to follow up at [facility] for med management and therapy.  One challenge patient has is transportation and bus fare money.    [name] 01/24/2013,

## 2013-01-24 NOTE — Progress Notes (Signed)
Pt observed in her room in bed awake.  She became tearful as she talked about having a rough day.  She says she is not sleeping well, but does not let staff know that she is awake.  She says when she doesn't sleep, it makes her feel really bad, then she begins to think about using and that makes her feel worse.  This Clinical research associate spent time just letting pt talk.  Will inform on call provider about pt's concerns for additional sleep aid.  Pt denies SI/HI/AV.  Pt was encouraged to make her needs known to staff.  Pt voices understanding.  Support/encouragement given.  Safety maintained with q15 minute checks.

## 2013-01-24 NOTE — Progress Notes (Signed)
D: Patient denies SI/HI and A/V hallucinations; patient reports that she requested medication for sleep and that it was fair; reports appetite is improving ; reports energy level is low ; reports ability to pay attention is improving; rates depression as 1/10; rates hopelessness 10/10; rates anxiety as 1/10;   A: Monitored q 15 minutes; patient encouraged to attend groups; patient educated about medications; patient given medications per physician orders; patient encouraged to express feelings and/or concerns  R: Patient is bright and has some insight; patient's interaction with staff and peers is appropriate;patient is taking medications as prescribed and tolerating medications; patient is attending all groups

## 2013-01-24 NOTE — Progress Notes (Signed)
Patient ID: [name], female   DOB: [birth year], 57 y.o.   MRN: [id number] [facility] MD Progress Note  01/24/2013 10:43 AM Lauren Lucas  MRN:  [id number]  Subjective:  Lauren Lucas endorses that she is feeling stronger today, but continue to sleep poorly at night. She added that she does not generally feel well if she does not sleep well at night. She states that she tries to sleep well, and once this happens, some staff member will come and wake her up. Feels upbeat, but does not like her situation, however, trying very hard to make the best of the situation. Lauren Lucas states that she is working very hard to change her thinking pattern and attitude. She states that her plans after discharge is to resume her daily AA/NA meetings and gain some weight. Rated her depression at #1.  Diagnosis:  Opioid dependence/withdrawal, Major Depression, GAD  ADL's:  Intact  Sleep: Poor, per patient's reports, and 6.25 hours per documentation.   Appetite:  Poor  Suicidal Ideation:  Plan:  Denies Intent:  Denies Means:  Denies Homicidal Ideation:  Plan:  Denies Intent:  Denies Means:  Denies  AEB (as evidenced by): Per patient's reports.  Psychiatric Specialty Exam: Review of Systems  Constitutional: Positive for weight loss and malaise/fatigue.  HENT: Negative.   Eyes: Negative.   Respiratory: Negative.   Cardiovascular: Negative.   Gastrointestinal: Negative.   Genitourinary: Negative.   Musculoskeletal: Positive for myalgias.  Skin: Negative.   Neurological: Positive for weakness.  Endo/Heme/Allergies: Negative.   Psychiatric/Behavioral: Positive for depression and substance abuse. The patient is nervous/anxious and has insomnia.     Blood pressure 121/84, pulse 67, temperature 99.3 F (37.4 C), temperature source Oral, resp. rate 16, height 5' 0.25" (1.53 m), weight 47.174 kg (104 lb), SpO2 99.00%.Body mass index is 20.15 kg/(m^2).  General Appearance: Disheveled  Eye Solicitor::   Fair  Speech:  Clear and Coherent and Slow  Volume:  Decreased  Mood:  Anxious, Depressed and worried, but feels upbeat.  Affect:  Restricted  Thought Process:  Coherent and Goal Directed  Orientation:  Full (Time, Place, and Person)  Thought Content:  WDL, Rumination and worries, concerns  Suicidal Thoughts:  No  Homicidal Thoughts:  No  Memory:  Immediate;   Fair Recent;   Fair Remote;   Fair  Judgement:  Fair  Insight:  Present  Psychomotor Activity:  Restlessness  Concentration:  Fair  Recall:  Fair  Akathisia:  No  Handed:  Right  AIMS (if indicated):     Assets:  Desire for Improvement  Sleep:  Number of Hours: 6.25   Current Medications: Current Facility-Administered Medications  Medication Dose Route Frequency Provider Last Rate Last Dose  . acetaminophen (TYLENOL) tablet 650 mg  650 mg Oral Q6H PRN Sanjuana Kava, NP      . alum & mag hydroxide-simeth (MAALOX/MYLANTA) 200-200-20 MG/5ML suspension 30 mL  30 mL Oral Q4H PRN Sanjuana Kava, NP      . ciprofloxacin (CIPRO) tablet 500 mg  500 mg Oral BID Sanjuana Kava, NP   500 mg at 01/24/13 0754  . citalopram (CELEXA) tablet 30 mg  30 mg Oral Daily Rachael Fee, MD   30 mg at 01/24/13 0754  . dicyclomine (BENTYL) tablet 20 mg  20 mg Oral Q6H PRN Rachael Fee, MD      . feeding supplement (ENSURE) pudding 1 Container  1 Container Oral TID BM Rachael Fee, MD  1 Container at 01/23/13 1516  . gabapentin (NEURONTIN) capsule 100 mg  100 mg Oral TID PRN Rachael Fee, MD      . hydrocortisone cream 1 %   Topical QID Verne Spurr, PA-C   1 application at 01/23/13 0754  . ibuprofen (ADVIL,MOTRIN) tablet 400 mg  400 mg Oral Q6H PRN Verne Spurr, PA-C      . loperamide (IMODIUM) capsule 2-4 mg  2-4 mg Oral PRN Rachael Fee, MD      . magnesium hydroxide (MILK OF MAGNESIA) suspension 30 mL  30 mL Oral Daily PRN Sanjuana Kava, NP      . methocarbamol (ROBAXIN) tablet 500 mg  500 mg Oral Q8H PRN Rachael Fee, MD   500 mg at  01/23/13 2203  . multivitamin with minerals tablet 1 tablet  1 tablet Oral Daily Jeoffrey Massed, RD   1 tablet at 01/24/13 0754  . ondansetron (ZOFRAN-ODT) disintegrating tablet 4 mg  4 mg Oral Q6H PRN Rachael Fee, MD   4 mg at 01/20/13 0328  . traZODone (DESYREL) tablet 100 mg  100 mg Oral QHS PRN Larena Sox, MD   100 mg at 01/23/13 2203    Lab Results: No results found for this or any previous visit (from the past 48 hour(s)).  Physical Findings: AIMS: Facial and Oral Movements Muscles of Facial Expression: None, normal Lips and Perioral Area: None, normal Jaw: None, normal Tongue: None, normal,Extremity Movements Upper (arms, wrists, hands, fingers): None, normal Lower (legs, knees, ankles, toes): None, normal, Trunk Movements Neck, shoulders, hips: None, normal, Overall Severity Severity of abnormal movements (highest score from questions above): None, normal Incapacitation due to abnormal movements: None, normal Patient's awareness of abnormal movements (rate only patient's report): No Awareness, Dental Status Current problems with teeth and/or dentures?: No Does patient usually wear dentures?: No  CIWA:  CIWA-Ar Total: 6 COWS:  COWS Total Score: 2  Treatment Plan Summary: Daily contact with patient to assess and evaluate symptoms and progress in treatment Medication management  Plan: Supportive approach/coping skills/relapse prevention Continue detox treatment. Continue Celexa to 30 mg daily Monitor effectiveness/adverse effects of medications in use.  Medical Decision Making Problem Points:  Review of psycho-social stressors (1) Data Points:  Review of medication regiment & side effects (2)  I certify that inpatient services furnished can reasonably be expected to improve the patient's condition.   Armandina Stammer I 01/24/2013, 10:43 AM

## 2013-01-24 NOTE — Progress Notes (Signed)
Adult Psychoeducational Group Note  Date:  01/24/2013 Time:  2000 Group Topic/Focus:  AA--Addiction  Participation Level:  Minimal  Participation Quality:  Appropriate  Affect:  Depressed and Lethargic  Cognitive:  Alert and Oriented  Insight: Good  Engagement in Group:  Limited  Modes of Intervention:  Support  Additional Comments:  AA members did not arrive; patients watched a video on addiction; pt left early because she stated that she was feeling tired.  Humberto Seals Monique 01/24/2013, 9:40 PM

## 2013-01-24 NOTE — H&P (Signed)
Agree with assessment and plan Chandelle Harkey A. Rayni Nemitz, M.D. 

## 2013-01-24 NOTE — Progress Notes (Signed)
[facility] LCSW Group Therapy  01/24/2013  Type of Therapy:  Group Therapy 1:15 to 2:30  Participation Level:  Active  Participation Quality:  Appropriate, Attentive and Sharing  Affect:  Appropriate  Cognitive:  Appropriate  Insight:  Developing/Improving  Engagement in Therapy:  Engaged  Modes of Intervention:  Education, Orientation, Rapport Building, Socialization and Support  Summary of Progress/Problems: Patient attended group presentation by staff member of  [employer] ([employer]). Beth was attentive and appropriate during session and appeared to be able to relate to visitor sharing about importance of putting his recovery first above other concerns such as relationships, etc.   [name] 01/24/2013

## 2013-01-24 NOTE — Progress Notes (Signed)
Recreation Therapy Notes  01/24/2013         Time: 3:00pm      Group Topic/Focus: Teamwork, Communication, Decision Making  Participation Level: Active  Participation Quality: Appropriate  Affect: Appropriate  Cognitive: Appropriate   Additional Comments: Patient with peers were given the following instructions: You and your friends have just discovered a new piece of land no ones has any knowledge of. As a group you must decide which of the following 7 things you want to address: 1 - Name the Country 2 - Design a license plate 3 - Design a flag 4 - Choose a national bird 5 - Choose a Agricultural consultant 6 - Appoint yourselves to government offices 9 - Create any necessary laws. Patient with peers chose to address numbers 1, 3, 4, 5, and 9. Patient was additionally required to chose a job that served the community. Patient chose [employer] because "ugliness is depressing." Patient actively participated in group discussion.    Jearl Klinefelter 01/24/2013 4:15 PM

## 2013-01-25 MED ORDER — ONDANSETRON 4 MG PO TBDP
4.0000 mg | ORAL_TABLET | Freq: Four times a day (QID) | ORAL | Status: DC | PRN
  Administered 2013-01-25: 4 mg via ORAL

## 2013-01-25 NOTE — Progress Notes (Signed)
[facility] LCSW Aftercare Discharge Planning Group Note  01/25/2013   Participation Quality:  Appropriate  Affect: Flat  Cognitive: Alert and oriented  Insight:  Developing  Engagement in Group: Appropriate  Modes of Intervention:  Exploration and Support  Summary of Progress/Problems: Pt denies suicidal ideation and homicidal ideation.  On a scale of 1 to 10 with ten being the most ever experienced, the patient rates depression at a 4 and anxiety at a 3. Lauren Lucas reports she go a good night's sleep which has not happened in many days.  Patient reports she also was able to eat breakfast which is an improvement for her   Lauren Lucas 01/25/2013,

## 2013-01-25 NOTE — Tx Team (Signed)
Interdisciplinary Treatment Plan Update (Adult)  Date: 01/25/2013  Time Reviewed: 10:28 AM   Progress in Treatment: Attending groups: Yes Participating in groups: Yes Taking medication as prescribed:  Yes Tolerating medication:  Yes Family/Significant othe contact made: No Patient understands diagnosis: Yes Discussing patient identified problems/goals with staff: Yes Medical problems stabilized or resolved:  Yes Denies suicidal/homicidal ideation: Yes Patient has not harmed self or Others: Yes  New problem(s) identified: None Identified  Discharge Plan or Barriers:  CSW will obtain appointment at [facility]  Additional comments: N/A  Reason for Continuation of Hospitalization: Medical Issues: Nutrition considerations Medication considerations - considering naltrexone Withdrawal symptoms - Cravings, consideration    Estimated length of stay:  One day  For review of initial/current patient goals, please see plan of care.  Attendees: Patient:  Lauren Lucas   Family:     Physician:  Geoffery Lyons 01/25/2013 10:28 AM   Nursing:   Roswell Miners, RN 01/25/2013 10:28 AM   Clinical Social Worker Ronda Fairly 01/25/2013 10:28 AM   Other:  Robbie Louis, RN 01/25/2013 10:28 AM   Other:  Serena Colonel, PA 01/25/2013 10:28 AM   Other:  Liliane Bade, Transitional Coordinator  01/25/2013 10:28 AM   Other:   01/25/2013 10:28 AM    Scribe for Treatment Team:   Carney Bern, LCSWA  01/25/2013 10:28 AM

## 2013-01-25 NOTE — Progress Notes (Signed)
[facility] MD Progress Note  01/25/2013 2:14 PM Lauren Lucas  MRN:  [id number] Subjective:  Lauren Lucas endorses that she is still having problems with her eating, her appetite, her nausea. She is starting to get to feel a little better. Still worried and anxious when she gets out of here. She knows what she needs to do, now wants to be sure she does it. Diagnosis:  Opioid Dependence/withdrawal/GAD/Major Depression  ADL's:  Intact  Sleep: Fair  Appetite:  improving, still with nausea  Suicidal Ideation:  Plan:  denies Intent:  denies Means:  denies Homicidal Ideation:  Plan:  denies Intent:  denies Means:  denies AEB (as evidenced by):  Psychiatric Specialty Exam: Review of Systems  Constitutional: Positive for weight loss.  HENT: Negative.   Eyes: Negative.   Respiratory: Negative.   Cardiovascular: Negative.   Gastrointestinal: Positive for nausea and vomiting.  Genitourinary: Negative.   Musculoskeletal: Negative.   Skin: Negative.   Neurological: Positive for weakness.  Endo/Heme/Allergies: Negative.   Psychiatric/Behavioral: Positive for substance abuse. The patient is nervous/anxious.     Blood pressure 121/78, pulse 106, temperature 98.9 F (37.2 C), temperature source Oral, resp. rate 14, height 5' 0.25" (1.53 m), weight 47.174 kg (104 lb), SpO2 99.00%.Body mass index is 20.15 kg/(m^2).  General Appearance: Fairly Groomed and fairly nurished  Patent attorney::  Fair  Speech:  Clear and Coherent and Normal Rate  Volume:  Normal  Mood:  Anxious and worried  Affect:  Appropriate, Congruent and Full Range  Thought Process:  Coherent, Goal Directed, Intact, Linear and Logical  Orientation:  Full (Time, Place, and Person)  Thought Content:  WDL and worries and concenrs about her recovery, her ability to maintain  Suicidal Thoughts:  No  Homicidal Thoughts:  No  Memory:  Immediate;   Fair Recent;   Fair Remote;   Fair  Judgement:  Fair  Insight:  Fair  Psychomotor  Activity:  Normal  Concentration:  Good  Recall:  Good  Akathisia:  No  Handed:  Right  AIMS (if indicated):     Assets:  Desire for Improvement Housing Social Support  Sleep:  Number of Hours: 6.75   Current Medications: Current Facility-Administered Medications  Medication Dose Route Frequency Provider Last Rate Last Dose  . acetaminophen (TYLENOL) tablet 650 mg  650 mg Oral Q6H PRN Lauren Kava, NP      . alum & mag hydroxide-simeth (MAALOX/MYLANTA) 200-200-20 MG/5ML suspension 30 mL  30 mL Oral Q4H PRN Lauren Kava, NP      . citalopram (CELEXA) tablet 30 mg  30 mg Oral Daily Lauren Fee, MD   30 mg at 01/25/13 0802  . feeding supplement (ENSURE) pudding 1 Container  1 Container Oral TID BM Lauren Fee, MD   1 Container at 01/23/13 1516  . gabapentin (NEURONTIN) capsule 100 mg  100 mg Oral TID PRN Lauren Fee, MD   100 mg at 01/24/13 2134  . hydrocortisone cream 1 %   Topical QID Lauren Spurr, PA-C   1 application at 01/25/13 0802  . ibuprofen (ADVIL,MOTRIN) tablet 400 mg  400 mg Oral Q6H PRN Lauren Spurr, PA-C      . magnesium hydroxide (MILK OF MAGNESIA) suspension 30 mL  30 mL Oral Daily PRN Lauren Kava, NP      . multivitamin with minerals tablet 1 tablet  1 tablet Oral Daily Lauren Lucas, RD   1 tablet at 01/25/13 0803  . ondansetron (ZOFRAN-ODT)  disintegrating tablet 4 mg  4 mg Oral Q6H PRN Lauren Fee, MD   4 mg at 01/25/13 1213  . traZODone (DESYREL) tablet 100 mg  100 mg Oral QHS PRN Lauren Sox, MD   100 mg at 01/24/13 2134    Lab Results: No results found for this or any previous visit (from the past 48 hour(s)).  Physical Findings: AIMS: Facial and Oral Movements Muscles of Facial Expression: None, normal Lips and Perioral Area: None, normal Jaw: None, normal Tongue: None, normal,Extremity Movements Upper (arms, wrists, hands, fingers): None, normal Lower (legs, knees, ankles, toes): None, normal, Trunk Movements Neck, shoulders, hips: None,  normal, Overall Severity Severity of abnormal movements (highest score from questions above): None, normal Incapacitation due to abnormal movements: None, normal Patient's awareness of abnormal movements (rate only patient's report): No Awareness, Dental Status Current problems with teeth and/or dentures?: No Does patient usually wear dentures?: No  CIWA:  CIWA-Ar Total: 0 COWS:  COWS Total Score: 2  Treatment Plan Summary: Daily contact with patient to assess and evaluate symptoms and progress in treatment Medication management  Plan: Supportive approach/coping skills/ relapse prevention           Address the co morbidities  Medical Decision Making Problem Points:  Review of psycho-social stressors (1) Data Points:  Review of new medications or change in dosage (2)  I certify that inpatient services furnished can reasonably be expected to improve the patient's condition.   Lauren Lucas A 01/25/2013, 2:14 PM

## 2013-01-25 NOTE — Plan of Care (Signed)
Problem: Ineffective individual coping Goal: STG: Pt will be able to identify effective and ineffective STG: Pt will be able to identify effective and ineffective coping patterns  Outcome: Progressing Patient is attending groups and is willing participant.  Open with suggestions for others and becoming more willing to take some of her own advice.   Problem: Alteration in mood & ability to function due to Goal: LTG-Patient demonstrates decreased signs of withdrawal (Patient demonstrates decreased signs of withdrawal to the point the patient is safe to return home and continue treatment in an outpatient setting)  Outcome: Progressing Patient is improving, still reports continued cravings and is considering naltrexone to help with that.  Discussing with doctor pills verses shot.   Problem: Alteration in mood Goal: LTG-Pt's behavior demonstrates decreased signs of depression (Patient's behavior demonstrates decreased signs of depression to the point the patient is safe to return home and continue treatment in an outpatient setting)  Outcome: Progressing Patient's depression is improving; she currently reports depression at 3 on a scale of 1 to 10 with ten being the worst. Patient also reports she was able to sleep last evening.  Sleep has not been regular occurrence for patient.

## 2013-01-25 NOTE — Progress Notes (Signed)
Patient ID: Lauren Lucas, female   DOB: [birth year], 57 y.o.   MRN: [id number] She has been up and to groups interacting with peers and staff. Self inventory: Indicated that she had a little agitation has not requested and medication for  It.  She has requested medication today for nausea and for a headache that has been effective.

## 2013-01-25 NOTE — Progress Notes (Signed)
Pt reports she had a good day today since she got some sleep last night.  She denies SI/HI/AV.  She voices no complaints at this time.  She says she may be discharged home tomorrow.  She says she feels she is ready to go home so she can get on with her life.  She plans to go to her meetings and stop hanging around the wrong people.  She says her appetite is coming back and she doesn't want the Ensure pudding.  Support/encouragement given.  Safety maintained with q15 minute checks.

## 2013-01-25 NOTE — Progress Notes (Signed)
[facility] LCSW Group Therapy  01/25/2013   Type of Therapy: Group Therapy 1:15 to 2:30 PM  Participation Level:  Appropriate  Participation Quality:  Appropriate  Affect: Appropriate  Cognitive: Appropriate  Insight: Improving  Engagement in Therapy:  Appropriate  Modes of Intervention: Exploration, warmup, discussion reality testing and support  Summary of Progress/Problems: The focus of this group session was to process how we deal with difficult emotions and share with others the patterns that play out when we are reacting to the emotion verses the situation.  Lauren Lucas shared that one of the most difficult emotions she deals with is "is the depression ands emptiness from my isolation."  Lauren Lucas became  emotional at several points during group and reported that she was sad about how she has treated herself. 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    Lauren Lucas 01/25/2013,

## 2013-01-26 MED ORDER — TRAZODONE HCL 100 MG PO TABS: 100.0000 mg | ORAL_TABLET | Freq: Every evening | ORAL | Status: AC | PRN

## 2013-01-26 MED ORDER — DISULFIRAM 250 MG PO TABS: 250.0000 mg | ORAL_TABLET | Freq: Every day | ORAL | Status: DC

## 2013-01-26 MED ORDER — GABAPENTIN 100 MG PO CAPS: 100.0000 mg | ORAL_CAPSULE | Freq: Three times a day (TID) | ORAL | Status: DC | PRN

## 2013-01-26 MED ORDER — CITALOPRAM HYDROBROMIDE 10 MG PO TABS: 30.0000 mg | ORAL_TABLET | Freq: Every day | ORAL | Status: DC
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
## 2013-01-26 NOTE — BHH Suicide Risk Assessment (Signed)
Suicide Risk Assessment  Discharge Assessment     Demographic Factors:  Caucasian and Unemployed  Mental Status Per Nursing Assessment::   On Admission:     Current Mental Status by Physician: In full contact with reality. There are no suicidal ideas, plans or intent. Her mood is euthymic, her affect is appropiate. There are no sucidal ideas, plans or intent. She is going to pursue outpatient follow up   Loss Factors: NA  Historical Factors: NA  Risk Reduction Factors:   Positive social support  Continued Clinical Symptoms:  Depression:   Comorbid alcohol abuse/dependence Alcohol/Substance Abuse/Dependencies  Cognitive Features That Contribute To Risk: None identified   Suicide Risk:  Minimal: No identifiable suicidal ideation.  Patients presenting with no risk factors but with morbid ruminations; may be classified as minimal risk based on the severity of the depressive symptoms  Discharge Diagnoses:   AXIS I:  Opioid Dependence/withdrawal, Major Depression, GAD AXIS II:  Deferred AXIS III:   Past Medical History  Diagnosis Date  . Mitral valve prolapse   . Heroin addiction   . Depression   . Anxiety   . Hepatitis     Hep C for 10 years, asymtomatic   AXIS IV:  other psychosocial or environmental problems AXIS V:  61-70 mild symptoms  Plan Of Care/Follow-up recommendations:  Activity:  as tolerated Diet:  regular Follow up outpatient basis Is patient on multiple antipsychotic therapies at discharge:  No   Has Patient had three or more failed trials of antipsychotic monotherapy by history:  No  Recommended Plan for Multiple Antipsychotic Therapies: N/A   Christionna Poland A 01/26/2013, 12:34 PM

## 2013-01-26 NOTE — Progress Notes (Signed)
Patient denies SI/HI, denies A/V hallucinations. Patient verbalizes understanding of discharge instructions, follow up care and prescriptions. Patient given all belongings from [facility] locker. Patient escorted out by staff, transported by family. 

## 2013-01-26 NOTE — Progress Notes (Signed)
Patient ID: Lauren Lucas, female   DOB: 1956-04-25, 57 y.o.   MRN: [id number] She spoke of going home today and of her plan to  attend AA Meetings and to eat healthy food so that she can gain weight.  Self inventory answers to questions were O's.

## 2013-01-26 NOTE — Progress Notes (Signed)
[facility] Adult Case Management Discharge Plan :  Will you be returning to the same living situation after discharge: Yes,  home At discharge, do you have transportation home?:Yes,  friend Do you have the ability to pay for your medications:Yes,  mental health  Release of information consent forms completed and in the chart;  Patient's signature needed at discharge.  Patient to Follow up at: Follow-up Information   Follow up with [facility] On 01/31/2013. (6:15PM with Sherwood Gambler)    Contact information:   [address]  [place]  [336] [phone]      Patient denies SI/HI:   Yes,  yes     Safety Planning and Suicide Prevention discussed:  Yes,  yes  Bertha Lokken, Ledell Peoples 01/26/2013, 10:53 AM

## 2013-01-26 NOTE — Progress Notes (Signed)
[facility] LCSW Aftercare Discharge Planning Group Note  01/26/2013 9:53 AM  Participation Quality:  Appropriate  Affect:  Anxious  Cognitive:  Alert  Insight:  Engaged  Engagement in Group:  Engaged  Modes of Intervention:  Clarification, Discussion, Problem-solving, Socialization and Support  Summary of Progress/Problems:Pt reported that she is slightly apprehensive about leaving due to her inability to get transportation at times, making it difficult to attend AA meetings. She is scheduled to discharge today and will f/u at [facility], attend AA meetings daily, and surround herself with positive friends. She rates her depression as a 0.5, anxiety as 1, hoplessness as 0, and reports no SI/HI.   [name] N 01/26/2013, 9:53 AM

## 2013-01-30 NOTE — Progress Notes (Signed)
Patient Discharge Instructions:  After Visit Summary (AVS):   Faxed to:  01/30/13 Discharge Summary Note:   Faxed to:  01/30/13 Psychiatric Admission Assessment Note:   Faxed to:  01/30/13 Suicide Risk Assessment - Discharge Assessment:   Faxed to:  01/30/13 Faxed/Sent to the Next Level Care provider:  01/30/13 Faxed to [facility] @ [phone]  Jerelene Redden, 01/30/2013, 4:12 PM

## 2013-02-03 NOTE — Discharge Summary (Signed)
Agree with assessment and plan Frenchie Pribyl A. Marelly Wehrman, M.D. 

## 2013-03-15 ENCOUNTER — Encounter (HOSPITAL_COMMUNITY): Payer: Self-pay | Admitting: Emergency Medicine

## 2013-03-15 ENCOUNTER — Emergency Department (HOSPITAL_COMMUNITY)
Admission: EM | Admit: 2013-03-15 | Discharge: 2013-03-15 | Disposition: A | Discharge status: Home / Self Care | Payer: Medicaid Other | Attending: Emergency Medicine | Admitting: Emergency Medicine

## 2013-03-15 DIAGNOSIS — T401X4A Poisoning by heroin, undetermined, initial encounter: Secondary | ICD-10-CM | POA: Insufficient documentation

## 2013-03-15 DIAGNOSIS — F329 Major depressive disorder, single episode, unspecified: Secondary | ICD-10-CM | POA: Insufficient documentation

## 2013-03-15 DIAGNOSIS — Z8679 Personal history of other diseases of the circulatory system: Secondary | ICD-10-CM | POA: Insufficient documentation

## 2013-03-15 DIAGNOSIS — F411 Generalized anxiety disorder: Secondary | ICD-10-CM | POA: Insufficient documentation

## 2013-03-15 DIAGNOSIS — Y939 Activity, unspecified: Secondary | ICD-10-CM | POA: Insufficient documentation

## 2013-03-15 DIAGNOSIS — F3289 Other specified depressive episodes: Secondary | ICD-10-CM | POA: Insufficient documentation

## 2013-03-15 DIAGNOSIS — Z8619 Personal history of other infectious and parasitic diseases: Secondary | ICD-10-CM | POA: Insufficient documentation

## 2013-03-15 DIAGNOSIS — T401X1A Poisoning by heroin, accidental (unintentional), initial encounter: Secondary | ICD-10-CM

## 2013-03-15 DIAGNOSIS — F1120 Opioid dependence, uncomplicated: Secondary | ICD-10-CM

## 2013-03-15 DIAGNOSIS — Z79899 Other long term (current) drug therapy: Secondary | ICD-10-CM | POA: Insufficient documentation

## 2013-03-15 DIAGNOSIS — Y929 Unspecified place or not applicable: Secondary | ICD-10-CM | POA: Insufficient documentation

## 2013-03-15 LAB — COMPREHENSIVE METABOLIC PANEL
ALT: 35 U/L (ref 0–35)
AST: 40 U/L — ABNORMAL HIGH (ref 0–37)
Alkaline Phosphatase: 81 U/L (ref 39–117)
CO2: 30 mEq/L (ref 19–32)
Calcium: 9.3 mg/dL (ref 8.4–10.5)
Chloride: 102 mEq/L (ref 96–112)
GFR calc Af Amer: >90 mL/min (ref >90)
GFR calc non Af Amer: >90 mL/min (ref >90)
Glucose, Bld: 95 mg/dL (ref 70–99)
Potassium: 3.8 mEq/L (ref 3.5–5.1)
Sodium: 141 mEq/L (ref 135–145)

## 2013-03-15 LAB — URINALYSIS, ROUTINE W REFLEX MICROSCOPIC
Bilirubin Urine: NEGATIVE
Hgb urine dipstick: NEGATIVE
Nitrite: NEGATIVE
Protein, ur: NEGATIVE mg/dL
Urobilinogen, UA: 0.2 mg/dL (ref 0.0–1.0)

## 2013-03-15 LAB — CBC
Hemoglobin: 12.5 g/dL (ref 12.0–15.0)
MCH: 29 pg (ref 26.0–34.0)
Platelets: 358 10*3/uL (ref 150–400)
RBC: 4.31 MIL/uL (ref 3.87–5.11)
WBC: 8 10*3/uL (ref 4.0–10.5)

## 2013-03-15 LAB — RAPID URINE DRUG SCREEN, HOSP PERFORMED
Amphetamines: NOT DETECTED
Barbiturates: NOT DETECTED
Opiates: POSITIVE — AB
Tetrahydrocannabinol: NOT DETECTED

## 2013-03-15 LAB — URINE MICROSCOPIC-ADD ON

## 2013-03-15 LAB — ACETAMINOPHEN LEVEL: Acetaminophen (Tylenol), Serum: <15 ug/mL (ref 10–30)

## 2013-03-15 NOTE — ED Provider Notes (Signed)
History     CSN: [id number]  Arrival date & time 03/15/13  1816   First MD Initiated Contact with Patient 03/15/13 1837      Chief Complaint  Patient presents with  . Drug Overdose     HPI Pt states that she overdosed on heroin but "was not trying kill herself". States that she was just trying to get high.   Past Medical History  Diagnosis Date  . Mitral valve prolapse   . Heroin addiction   . Depression   . Anxiety   . Hepatitis     Hep C for 10 years, asymtomatic    Past Surgical History  Procedure Laterality Date  . Splenectomy    . Dental surgery      Family History  Problem Relation Age of Onset  . Hypertension Mother   . Cancer Mother   . Hypertension Father   . Heart failure Father     History  Substance Use Topics  . Smoking status: Never Smoker   . Smokeless tobacco: Never Used  . Alcohol Use: No     Comment: None for one and half years    OB History   Grav Para Term Preterm Abortions TAB SAB Ect Mult Living                  Review of Systems  All other systems reviewed and are negative.    Allergies  Antihistamines, diphenhydramine-type and Sulfa antibiotics  Home Medications   Current Outpatient Rx  Name  Route  Sig  Dispense  Refill  . ALPRAZolam (XANAX) 0.5 MG tablet   Oral   Take 0.5 mg by mouth once.         . citalopram (CELEXA) 20 MG tablet   Oral   Take 20 mg by mouth daily.         Marland Kitchen gabapentin (NEURONTIN) 100 MG capsule   Oral   Take 100 mg by mouth 3 (three) times daily as needed (for anxiety/nerve pain.).         Marland Kitchen ibuprofen (ADVIL,MOTRIN) 200 MG tablet   Oral   Take 800 mg by mouth every 8 (eight) hours as needed for pain.         . traZODone (DESYREL) 100 MG tablet   Oral   Take 1 tablet (100 mg total) by mouth at bedtime as needed for sleep. For depression/sleep   30 tablet   0     BP 113/63  Pulse 87  Temp(Src) 98.9 F (37.2 C) (Oral)  Resp 18  SpO2 100%  Physical Exam  Nursing note and  vitals reviewed. Constitutional: She is oriented to person, place, and time. She appears well-developed and well-nourished. No distress.  HENT:  Head: Normocephalic and atraumatic.  Eyes: Pupils are equal, round, and reactive to light.  Neck: Normal range of motion.  Cardiovascular: Normal rate and intact distal pulses.   Pulmonary/Chest: No respiratory distress.  Abdominal: Normal appearance. She exhibits no distension.  Musculoskeletal: Normal range of motion.  Neurological: She is alert and oriented to person, place, and time. No cranial nerve deficit.  Skin: Skin is warm and dry. No rash noted.  Psychiatric: She has a normal mood and affect. Her speech is normal and behavior is normal. Judgment normal. Cognition and memory are normal. She does not exhibit a depressed mood. She expresses no homicidal and no suicidal ideation. She expresses no suicidal plans and no homicidal plans.    ED Course  Procedures (  including critical care time)  Labs Reviewed  COMPREHENSIVE METABOLIC PANEL - Abnormal; Notable for the following:    Albumin 3.2 (*)    AST 40 (*)    Total Bilirubin 0.1 (*)    All other components within normal limits  SALICYLATE LEVEL - Abnormal; Notable for the following:    Salicylate Lvl <2.0 (*)    All other components within normal limits  URINE RAPID DRUG SCREEN (HOSP PERFORMED) - Abnormal; Notable for the following:    Opiates POSITIVE (*)    Benzodiazepines POSITIVE (*)    All other components within normal limits  URINALYSIS, ROUTINE W REFLEX MICROSCOPIC - Abnormal; Notable for the following:    APPearance CLOUDY (*)    Leukocytes, UA TRACE (*)    All other components within normal limits  URINE MICROSCOPIC-ADD ON - Abnormal; Notable for the following:    Squamous Epithelial / LPF FEW (*)    All other components within normal limits  ACETAMINOPHEN LEVEL  CBC  ETHANOL   No results found.   No diagnosis found.    MDM  Patient observed in the emergency  department remained awake alert states she is at her baseline.  Patient does not want to be detoxed.  She has a support group at home and she will followup with her drug rehabilitation center.        Nelia Shi, MD 03/16/13 1325

## 2013-03-15 NOTE — ED Notes (Signed)
Per EMS, family member found patient on the floor, non-responsive,snoring, lips blue-k2 mg of narcan given IM, became responsive shortly there after-admitted heroin and xanax use-unaware of the amount-per patient it was not intentional-was off heroin for 3 years, started using about month ago-was at [facility] 6 weeks ago

## 2013-03-15 NOTE — ED Notes (Signed)
ems attempted multiple IVs and EJ unsuccessful. rn unsuccessful IV attempts. Vast scar tissue from drug abuse.

## 2013-03-15 NOTE — ED Notes (Signed)
[id number]<GE> Expected date:<BR> Expected time:<BR> Means of arrival:<BR> Comments:<BR> 57 y/o F overdose

## 2013-03-15 NOTE — ED Notes (Signed)
IV team paged.  

## 2013-03-15 NOTE — ED Notes (Signed)
Pt states that she overdosed on heroin but "was not trying kill herself".  States that she was just trying to get high.
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
## 2013-10-13 ENCOUNTER — Emergency Department (HOSPITAL_COMMUNITY)
Admission: EM | Admit: 2013-10-13 | Discharge: 2013-10-13 | Disposition: A | Discharge status: Home / Self Care | Payer: Medicaid Other | Attending: Emergency Medicine | Admitting: Emergency Medicine

## 2013-10-13 ENCOUNTER — Emergency Department (HOSPITAL_COMMUNITY): Payer: Medicaid Other

## 2013-10-13 ENCOUNTER — Encounter (HOSPITAL_COMMUNITY): Payer: Self-pay | Admitting: Emergency Medicine

## 2013-10-13 DIAGNOSIS — F329 Major depressive disorder, single episode, unspecified: Secondary | ICD-10-CM | POA: Insufficient documentation

## 2013-10-13 DIAGNOSIS — F3289 Other specified depressive episodes: Secondary | ICD-10-CM | POA: Insufficient documentation

## 2013-10-13 DIAGNOSIS — Y9289 Other specified places as the place of occurrence of the external cause: Secondary | ICD-10-CM | POA: Insufficient documentation

## 2013-10-13 DIAGNOSIS — R55 Syncope and collapse: Secondary | ICD-10-CM | POA: Insufficient documentation

## 2013-10-13 DIAGNOSIS — F411 Generalized anxiety disorder: Secondary | ICD-10-CM | POA: Insufficient documentation

## 2013-10-13 DIAGNOSIS — T401X1A Poisoning by heroin, accidental (unintentional), initial encounter: Secondary | ICD-10-CM | POA: Insufficient documentation

## 2013-10-13 DIAGNOSIS — T401X4A Poisoning by heroin, undetermined, initial encounter: Secondary | ICD-10-CM | POA: Insufficient documentation

## 2013-10-13 DIAGNOSIS — Z79899 Other long term (current) drug therapy: Secondary | ICD-10-CM | POA: Insufficient documentation

## 2013-10-13 DIAGNOSIS — Z8619 Personal history of other infectious and parasitic diseases: Secondary | ICD-10-CM | POA: Insufficient documentation

## 2013-10-13 DIAGNOSIS — Z8679 Personal history of other diseases of the circulatory system: Secondary | ICD-10-CM | POA: Insufficient documentation

## 2013-10-13 DIAGNOSIS — Y939 Activity, unspecified: Secondary | ICD-10-CM | POA: Insufficient documentation

## 2013-10-13 LAB — URINE MICROSCOPIC-ADD ON

## 2013-10-13 LAB — RAPID URINE DRUG SCREEN, HOSP PERFORMED
Amphetamines: NOT DETECTED
Barbiturates: NOT DETECTED
Cocaine: NOT DETECTED
Tetrahydrocannabinol: NOT DETECTED

## 2013-10-13 LAB — URINALYSIS, ROUTINE W REFLEX MICROSCOPIC
Ketones, ur: NEGATIVE mg/dL
Nitrite: NEGATIVE
Specific Gravity, Urine: 1.021 (ref 1.005–1.030)
Urobilinogen, UA: 0.2 mg/dL (ref 0.0–1.0)

## 2013-10-13 LAB — COMPREHENSIVE METABOLIC PANEL
CO2: 32 mEq/L (ref 19–32)
Calcium: 9.1 mg/dL (ref 8.4–10.5)
Creatinine, Ser: 0.62 mg/dL (ref 0.50–1.10)
GFR calc Af Amer: >90 mL/min (ref >90)
GFR calc non Af Amer: >90 mL/min (ref >90)
Glucose, Bld: 96 mg/dL (ref 70–99)

## 2013-10-13 LAB — CBC
Hemoglobin: 12 g/dL (ref 12.0–15.0)
MCH: 29 pg (ref 26.0–34.0)
MCHC: 33.5 g/dL (ref 30.0–36.0)
MCV: 86.5 fL (ref 78.0–100.0)
RBC: 4.14 MIL/uL (ref 3.87–5.11)

## 2013-10-13 LAB — GLUCOSE, CAPILLARY: Glucose-Capillary: 81 mg/dL (ref 70–99)

## 2013-10-13 LAB — ACETAMINOPHEN LEVEL: Acetaminophen (Tylenol), Serum: <15 ug/mL (ref 10–30)

## 2013-10-13 LAB — ETHANOL: Alcohol, Ethyl (B): <11 mg/dL (ref 0–11)

## 2013-10-13 MED ORDER — SODIUM CHLORIDE 0.9 % IV BOLUS (SEPSIS)
1000.0000 mL | Freq: Once | INTRAVENOUS | Status: AC
  Administered 2013-10-13: 1000 mL via INTRAVENOUS

## 2013-10-13 NOTE — ED Notes (Signed)
Per EMS called to residence for heroin overdose. Pt found minimally responsive. Narcan 1 mg given per EMS. Pt alert x4 respirations easy non labored at this time.

## 2013-10-13 NOTE — ED Notes (Signed)
Pt denies SI or HI .  

## 2013-10-13 NOTE — ED Notes (Signed)
Pt has jeans, socks, shoes, 3 sweaters, bra, and shoes.  Theses items are placed in a personal bag in the room.   Pt family member is with her.

## 2013-10-13 NOTE — ED Notes (Signed)
Bed: WU98 Expected date: 10/13/13 Expected time: 8:08 PM Means of arrival: Ambulance Comments: Bed 15, EMS, 61 F, Heroin OD

## 2013-10-13 NOTE — ED Provider Notes (Signed)
CSN: [id number]     Arrival date & time 10/13/13  2012 History   First MD Initiated Contact with Patient 10/13/13 2045     Chief Complaint  Patient presents with  . Drug Overdose   (Consider location/radiation/quality/duration/timing/severity/associated sxs/prior Treatment) HPI Comments: 57 year old female presents after a heroin overdose. She was at a party and states she must of had to my care when. As far she knows that her when was clean and had no other additives. She states that she felt mild headache after she was given 1 mg of Narcan by EMS for that headache is resolved. She currently feels normal. Do not have a temperature of 100.3 in the ER, however patient states she's not had any infectious symptoms such as fever, chills, cough, shortness of breath, vomiting, diarrhea, or dysuria. She states she does get frequent UTIs she should not be surprised she have one.   Past Medical History  Diagnosis Date  . Mitral valve prolapse   . Heroin addiction   . Depression   . Anxiety   . Hepatitis     Hep C for 10 years, asymtomatic   Past Surgical History  Procedure Laterality Date  . Splenectomy    . Dental surgery    . Cholecystectomy    . Abdominal hysterectomy     Family History  Problem Relation Age of Onset  . Hypertension Mother   . Cancer Mother   . Hypertension Father   . Heart failure Father    History  Substance Use Topics  . Smoking status: Never Smoker   . Smokeless tobacco: Never Used  . Alcohol Use: No     Comment: None for one and half years   OB History   Grav Para Term Preterm Abortions TAB SAB Ect Mult Living                 Review of Systems  Constitutional: Negative for fever and chills.  HENT: Negative for congestion and rhinorrhea.   Respiratory: Negative for cough and shortness of breath.   Cardiovascular: Negative for chest pain.  Gastrointestinal: Negative for vomiting and abdominal pain.  Genitourinary: Negative for dysuria.  Skin:  Negative for color change and rash.  Neurological: Positive for syncope. Negative for weakness and headaches.  All other systems reviewed and are negative.    Allergies  Antihistamines, diphenhydramine-type and Sulfa antibiotics  Home Medications   Current Outpatient Rx  Name  Route  Sig  Dispense  Refill  . citalopram (CELEXA) 20 MG tablet   Oral   Take 20 mg by mouth daily.         Marland Kitchen gabapentin (NEURONTIN) 100 MG capsule   Oral   Take 100 mg by mouth 3 (three) times daily as needed (for anxiety/nerve pain.).         Marland Kitchen ibuprofen (ADVIL,MOTRIN) 200 MG tablet   Oral   Take 800 mg by mouth every 8 (eight) hours as needed (pain).          . traZODone (DESYREL) 100 MG tablet   Oral   Take 1 tablet (100 mg total) by mouth at bedtime as needed for sleep. For depression/sleep   30 tablet   0    BP 128/62  Pulse 106  Temp(Src) 100.3 F (37.9 C) (Oral)  Resp 16  SpO2 98% Physical Exam  Nursing note and vitals reviewed. Constitutional: She is oriented to person, place, and time. She appears well-developed and well-nourished. No distress.  HENT:  Head:  Normocephalic and atraumatic.  Right Ear: External ear normal.  Left Ear: External ear normal.  Nose: Nose normal.  Eyes: Pupils are equal, round, and reactive to light. Right eye exhibits no discharge. Left eye exhibits no discharge.  Cardiovascular: Normal rate, regular rhythm and normal heart sounds.   No murmur heard. Pulmonary/Chest: Effort normal and breath sounds normal. She has no wheezes. She has no rales.  Abdominal: Soft. She exhibits no distension. There is no tenderness.  Neurological: She is alert and oriented to person, place, and time.  Skin: Skin is warm and dry. No erythema.    ED Course  Procedures (including critical care time) Labs Review Labs Reviewed  CBC - Abnormal; Notable for the following:    HCT 35.8 (*)    Platelets 417 (*)    All other components within normal limits  COMPREHENSIVE  METABOLIC PANEL - Abnormal; Notable for the following:    Albumin 3.4 (*)    AST 54 (*)    ALT 38 (*)    Total Bilirubin 0.2 (*)    All other components within normal limits  SALICYLATE LEVEL - Abnormal; Notable for the following:    Salicylate Lvl <2.0 (*)    All other components within normal limits  URINE RAPID DRUG SCREEN (HOSP PERFORMED) - Abnormal; Notable for the following:    Opiates POSITIVE (*)    Benzodiazepines POSITIVE (*)    All other components within normal limits  URINALYSIS, ROUTINE W REFLEX MICROSCOPIC - Abnormal; Notable for the following:    APPearance CLOUDY (*)    Leukocytes, UA LARGE (*)    All other components within normal limits  URINE MICROSCOPIC-ADD ON - Abnormal; Notable for the following:    Squamous Epithelial / LPF MANY (*)    Bacteria, UA MANY (*)    All other components within normal limits  URINE CULTURE  ETHANOL  ACETAMINOPHEN LEVEL  GLUCOSE, CAPILLARY   Imaging Review Dg Chest 2 View  10/13/2013   CLINICAL DATA:  Fever  EXAM: CHEST  2 VIEW  COMPARISON:  None.  FINDINGS: The heart size and mediastinal contours are within normal limits. Both lungs are clear. The visualized skeletal structures are unremarkable.  IMPRESSION: No active cardiopulmonary disease.   Electronically Signed   By: Esperanza Heir M.D.   On: 10/13/2013 21:57    EKG Interpretation     Ventricular Rate:  92 PR Interval:  132 QRS Duration: 86 QT Interval:  351 QTC Calculation: 434 R Axis:   12 Text Interpretation:  Normal sinus rhythm EKG WITHIN NORMAL LIMITS No significant change since last tracing            MDM   1. Heroin overdose, initial encounter    Patient remained alert, oriented and GCS of 15 while in ED. No need for repeat Narcan while she was here. Initially had oral temp of 37.9 but rectal temp shows no fever. Low concern for bacterial process. Will screen for PNA and UTI, but I feel these and a more severe infection such as meningitis or  endocarditis are very unlikely. Her urine is a dirty catch with bacteria and leukocytes, but many epithelial cells. Given that she is asymptomatic, will send for culture and hold off on treatment for now.  Will discharge after period of observation in ED to make sure she doesn't decompensate.    Audree Camel, MD 10/13/13 [phone]

## 2013-10-17 LAB — URINE CULTURE: Colony Count: 25000

## 2013-10-18 NOTE — Progress Notes (Signed)
Post ED Visit - Positive Culture Follow-up  Culture report reviewed by antimicrobial stewardship pharmacist: []  Wes Dulaney, Pharm.D., BCPS []  Celedonio Miyamoto, Pharm.D., BCPS []  Georgina Pillion, Pharm.D., BCPS []  [place], [address].D., BCPS, AAHIVP [x]  Estella Husk, Pharm.D., BCPS, AAHIVP  Positive Urine culture No urinary symptoms.  No treatment recommended.  ED Provider: Arthor Captain, PA-C  Estella Husk, Pharm.D., BCPS, AAHIVP Clinical Pharmacist Phone: [phone] or [phone] Pager: [phone] 10/18/2013, 10:12 AM

## 2014-01-31 DIAGNOSIS — I341 Nonrheumatic mitral (valve) prolapse: Secondary | ICD-10-CM | POA: Insufficient documentation

## 2014-02-15 ENCOUNTER — Ambulatory Visit: Payer: Self-pay | Admitting: Cardiology

## 2014-02-15 ENCOUNTER — Encounter: Payer: Self-pay | Admitting: *Deleted

## 2014-02-24 IMAGING — CR DG CHEST 2V
2 series · 2 of 2 positions shown · non-contrast
Comparison: None.

CLINICAL DATA: Fever

EXAM:
CHEST  2 VIEW

[w chest pa]
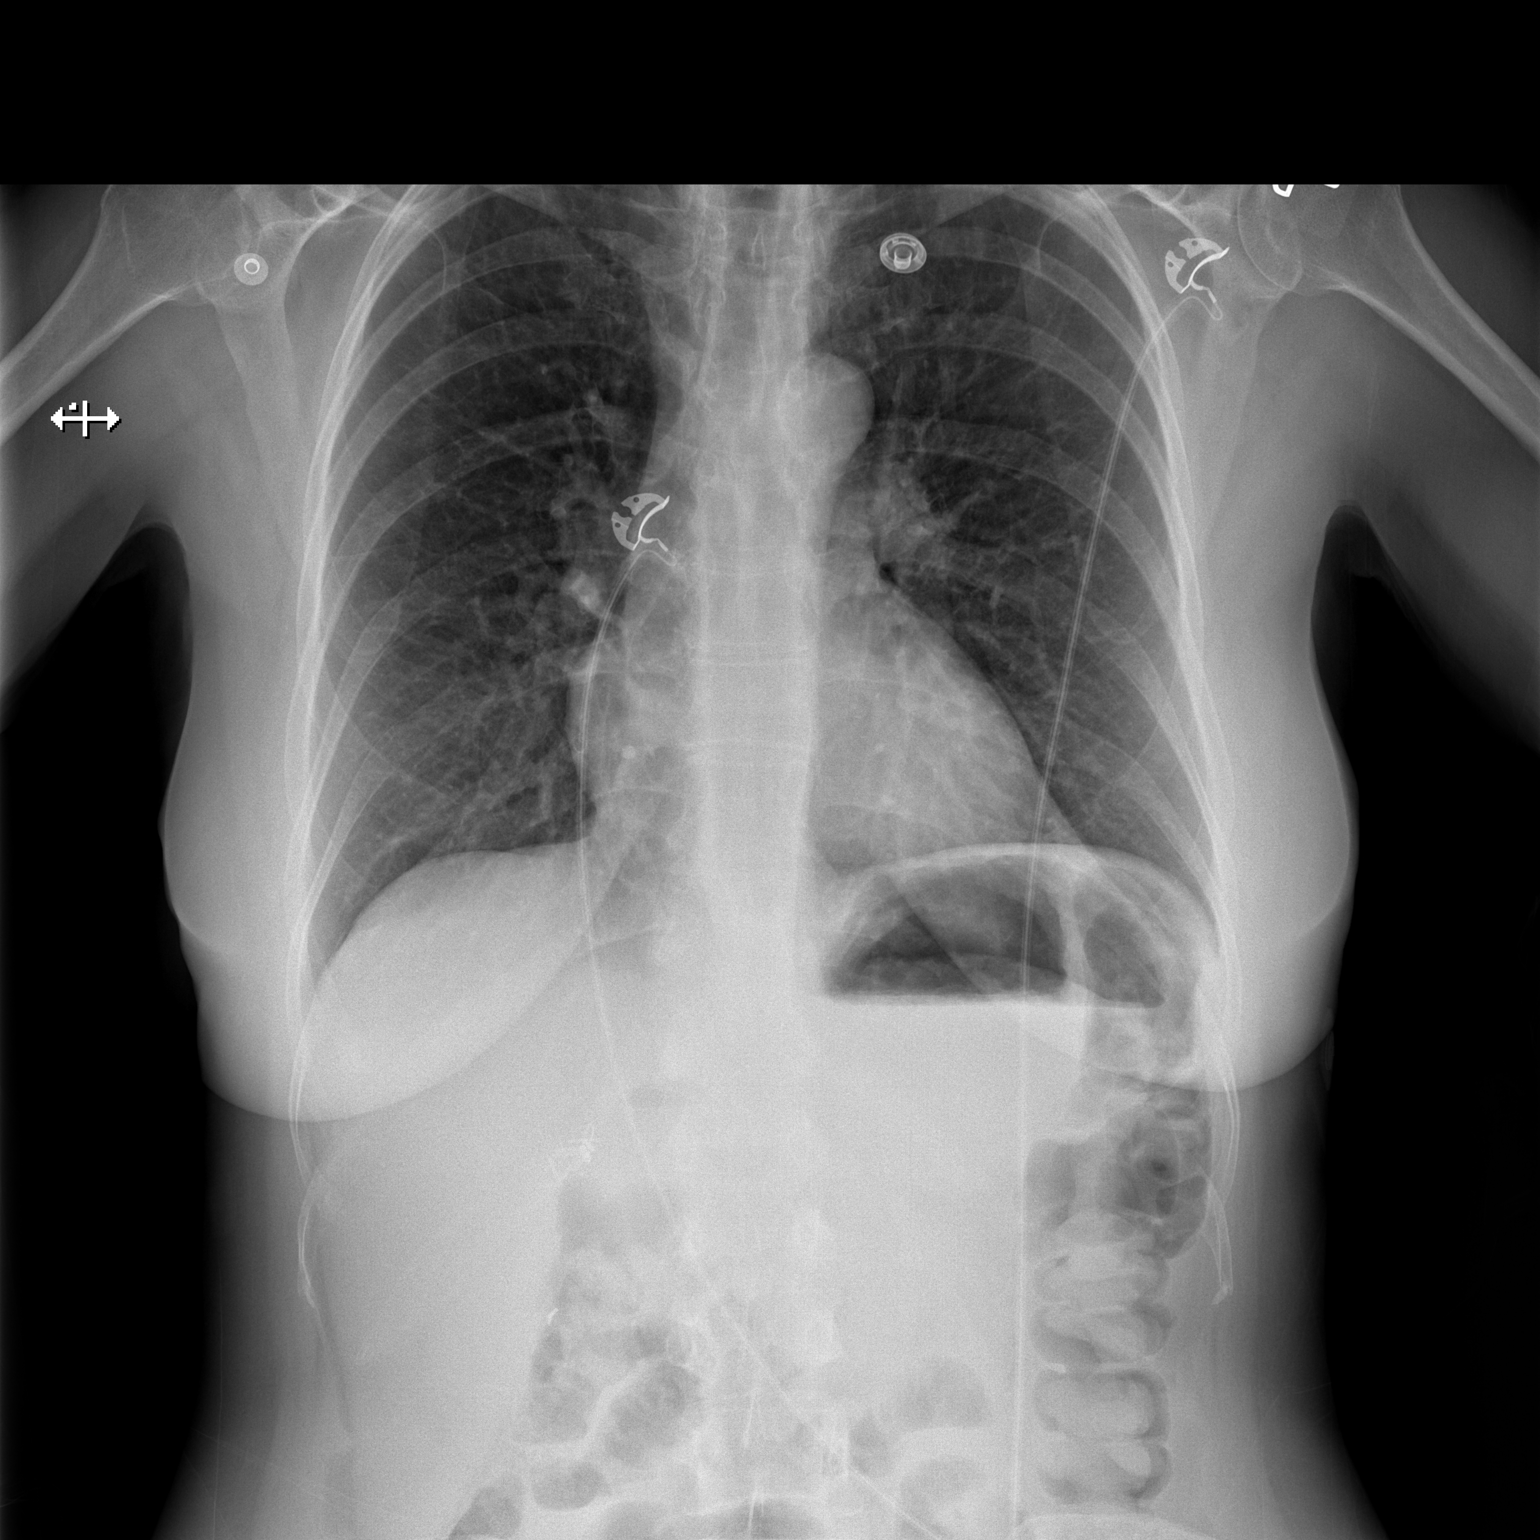

[w chest lat]
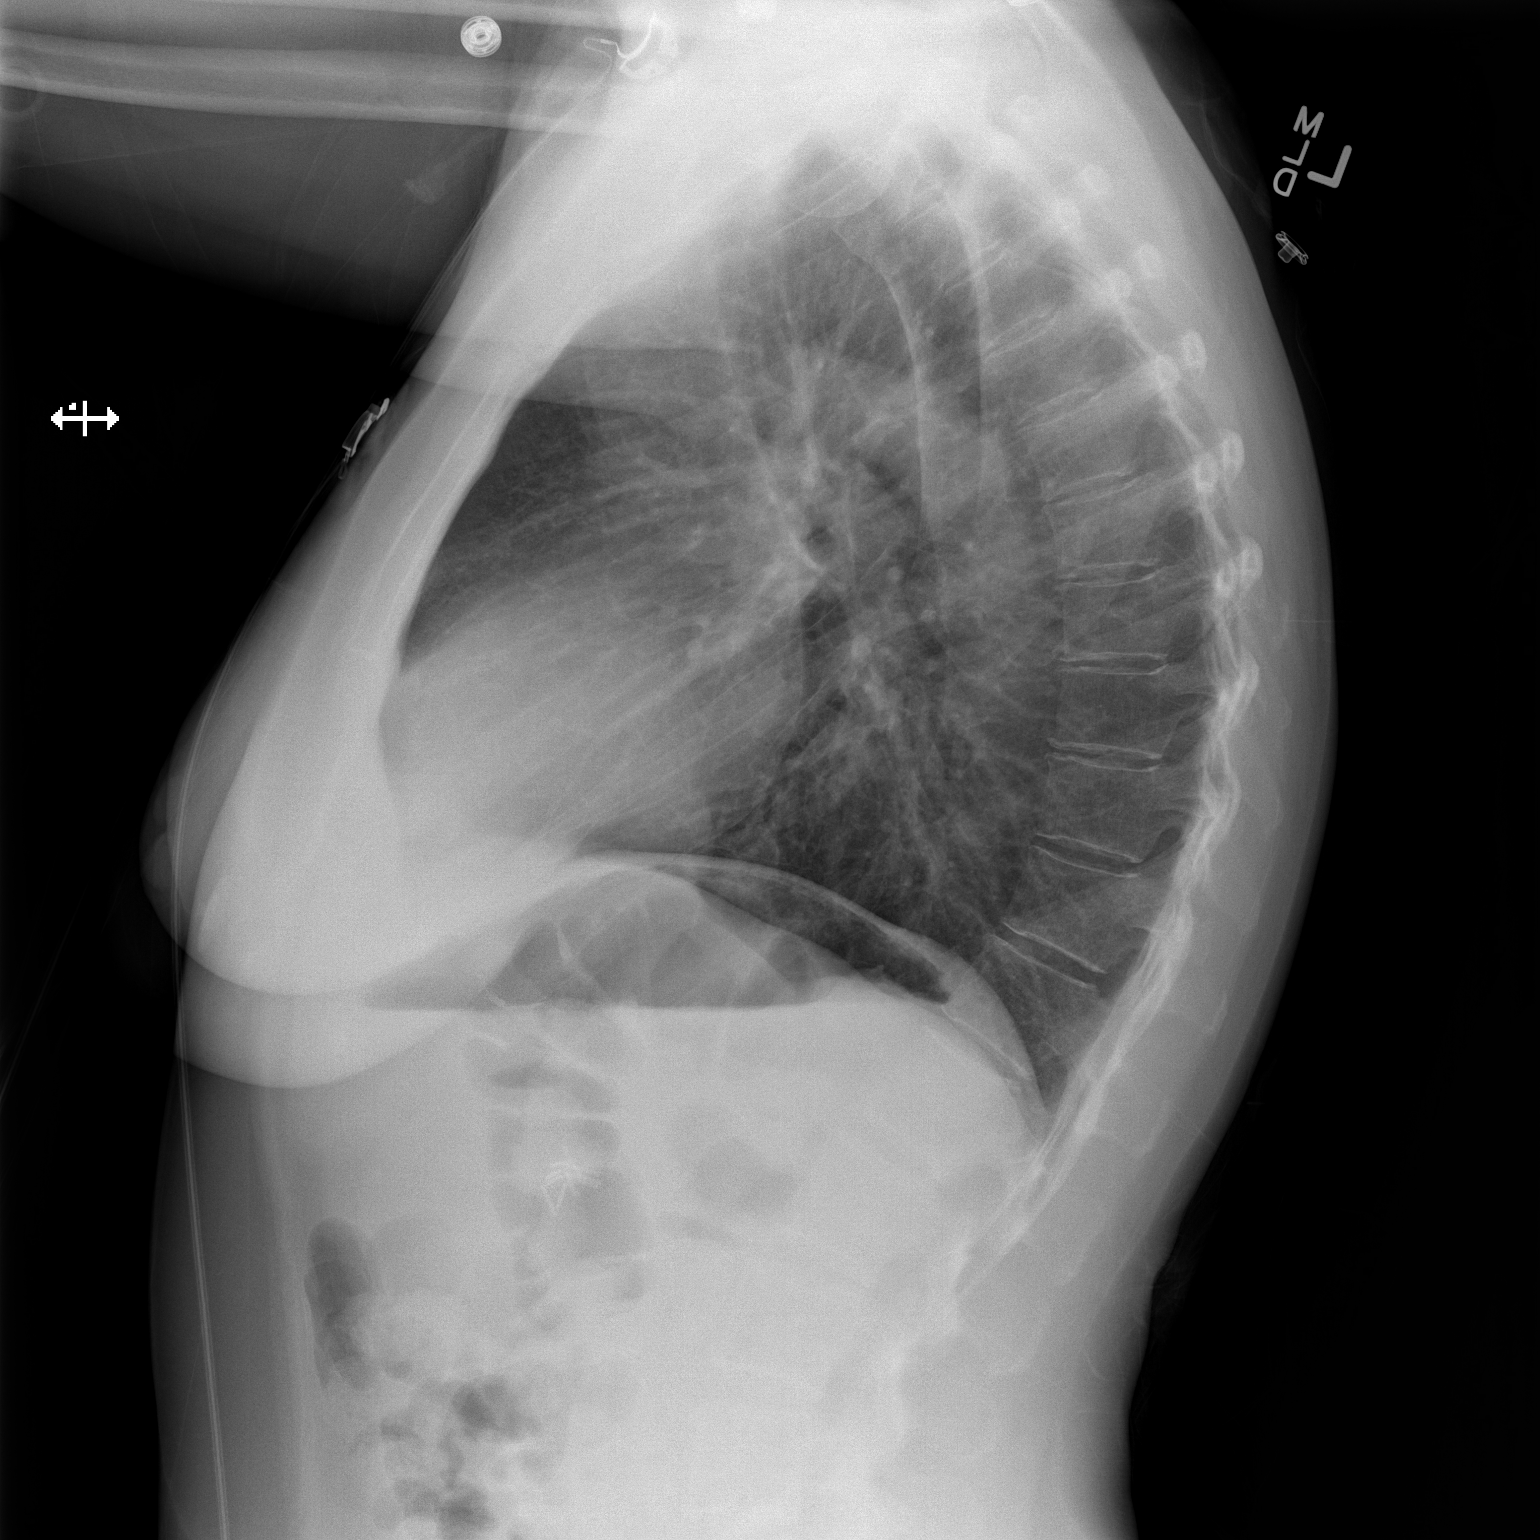

[2 of 2 positions shown; findings below may reference images not displayed]

FINDINGS: The heart size and mediastinal contours are within normal limits.
Both lungs are clear. The visualized skeletal structures are
unremarkable.
IMPRESSION: No active cardiopulmonary disease.

## 2014-03-29 ENCOUNTER — Ambulatory Visit: Payer: Medicaid Other | Admitting: Interventional Cardiology

## 2014-04-20 ENCOUNTER — Encounter: Payer: Self-pay | Admitting: Interventional Cardiology

## 2018-03-15 DIAGNOSIS — M75112 Incomplete rotator cuff tear or rupture of left shoulder, not specified as traumatic: Secondary | ICD-10-CM | POA: Insufficient documentation

## 2018-05-05 ENCOUNTER — Other Ambulatory Visit: Payer: Self-pay

## 2018-05-05 ENCOUNTER — Ambulatory Visit: Payer: Medicaid Other | Attending: Sports Medicine | Admitting: Physical Therapy

## 2018-05-05 DIAGNOSIS — M25512 Pain in left shoulder: Secondary | ICD-10-CM | POA: Insufficient documentation

## 2018-05-05 DIAGNOSIS — M25612 Stiffness of left shoulder, not elsewhere classified: Secondary | ICD-10-CM

## 2018-05-05 DIAGNOSIS — G8929 Other chronic pain: Secondary | ICD-10-CM

## 2018-05-05 NOTE — Therapy (Signed)
[facility] [address] [place], [place], [address] Phone: [phone]   Fax:  [phone]  Physical Therapy Evaluation  Patient Details  Name: Lauren Lucas MRN: [id number] Date of Birth: Dec 27, 1955 Referring Provider: Elinor Dodge, MD   Encounter Date: 05/05/2018  PT End of Session - 05/05/18 1150    Visit Number  1    Number of Visits  4 due to insurance limitation    Authorization Type  recommend clinically 2x/week for 4 weeks, pt limited by insurance     PT Start Time  1125    PT Stop Time  1205    PT Time Calculation (min)  40 min    Activity Tolerance  Patient tolerated treatment well;Treatment limited secondary to medical complications (Comment)    Behavior During Therapy  [facility] for tasks assessed/performed       Past Medical History:  Diagnosis Date  . Anxiety   . Depression   . Hepatitis    Hep C for 10 years, asymtomatic  . Heroin addiction   . Mitral valve prolapse     Past Surgical History:  Procedure Laterality Date  . ABDOMINAL HYSTERECTOMY    . CHOLECYSTECTOMY    . DENTAL SURGERY    . SPLENECTOMY      There were no vitals filed for this visit.   Subjective Assessment - 05/05/18 1123    Subjective  Pt arriving to therapy with left shoulder bursitis. Pt reporting pulling her arm away from her body hurts, carrying things hurts, and reaching behind her back to fasten her bra increases her pain    Diagnostic tests  X-ray    Patient Stated Goals  Stop hurting, be able to do my hair and put on my clothes without pain    Currently in Pain?  Yes    Pain Score  5     Pain Location  Shoulder    Pain Orientation  Left    Pain Descriptors / Indicators  Aching    Pain Type  Chronic pain has been going on for 8 years    Pain Radiating Towards  radiates from neck and down into fingertips    Pain Onset  More than a month ago    Pain Frequency  Constant    Aggravating Factors   lifting things  doing your hair, fastening bra, reaching away from body    Pain Relieving Factors  heat, taking a shower    Effect of Pain on Daily Activities  diffictulty with ADL's    Multiple Pain Sites  No         [facility] PT Assessment - 05/05/18 0001      Assessment   Medical Diagnosis  L shoulder bursitis/ shoulder pain    Referring Provider  Elinor Dodge, MD    Onset Date/Surgical Date  -- 8 years ago and has gotten worse    Hand Dominance  Right    Prior Therapy  no      Precautions   Precautions  None      Restrictions   Weight Bearing Restrictions  No      Balance Screen   Has the patient fallen in the past 6 months  Yes    How many times?  1    Has the patient had a decrease in activity level because of a fear of falling?   Yes    Is the patient reluctant to leave their home because of a fear of falling?  No      Home Environment   Living Environment  Private residence    Living Arrangements  Alone    Type of Home  Apartment    Home Access  Level entry      Prior Function   Level of Independence  Independent pain with dressing and grooming    Vocation  On disability      Cognition   Overall Cognitive Status  Within Functional Limits for tasks assessed      ROM / Strength   AROM / PROM / Strength  AROM;Strength      AROM   Overall AROM   Deficits    AROM Assessment Site  Shoulder    Right/Left Shoulder  Right;Left    Right Shoulder Extension  70 Degrees    Right Shoulder Flexion  165 Degrees    Right Shoulder ABduction  150 Degrees    Right Shoulder Internal Rotation  -- T10    Right Shoulder External Rotation  72 Degrees    Left Shoulder Extension  65 Degrees    Left Shoulder Flexion  40 Degrees PROM: 80 degrees with pain    Left Shoulder ABduction  60 Degrees with pain    Left Shoulder Internal Rotation  -- L2    Left Shoulder External Rotation  46 Degrees      Strength   Strength Assessment Site  Shoulder    Right/Left Shoulder  Right;Left    Right  Shoulder Flexion  4/5    Right Shoulder Extension  4/5    Right Shoulder ABduction  4/5    Right Shoulder Internal Rotation  4/5    Right Shoulder External Rotation  4/5    Left Shoulder Flexion  2+/5    Left Shoulder Extension  2+/5    Left Shoulder ABduction  2+/5    Left Shoulder Internal Rotation  2+/5    Left Shoulder External Rotation  2+/5      Special Tests    Special Tests  Rotator Cuff Impingement    Rotator Cuff Impingment tests  Empty Can test;Full Can test      Empty Can test   Findings  Positive    Side  Left      Full Can test   Findings  Positive    Side  Left      Ambulation/Gait   Gait Comments  decreased left arm swing                Objective measurements completed on examination: See above findings.              PT Education - 05/05/18 1149    Education Details  HEP    Person(s) Educated  Patient    Methods  Explanation;Demonstration;Handout;Verbal cues    Comprehension  Verbalized understanding;Returned demonstration          PT Long Term Goals - 05/05/18 1222      PT LONG TERM GOAL #1   Title  Pt will be independent with her HEP and progression.     Baseline  initial HEP issued today    Time  -- 3 visits    Status  New    Target Date  07/05/18      PT LONG TERM GOAL #2   Title  Pt will improve her active left shoulder flexion to >/= 150 degrees in order to improve functional mobility.     Baseline  AROM L shoulder flexion: 40 degrees  Status  New    Target Date  07/05/18      PT LONG TERM GOAL #3   Title  Pt will improve her active left Internal Rotation to be able to fasten and remove her bra.     Baseline  Currently reaching to L2    Status  New    Target Date  07/05/18             Plan - 05/05/18 1211    Clinical Impression Statement  Pt arriving to therapy with dx of left shoulder bursitis. Pt complaining of constant left shoulder pain which varies in intensity and has progressively gotten worse  over the lasy 3-4 months since a recent fall. Pt with limited AROM and left shoulder strength. Pt was issued a HEP. Skilled PT needed to progress pt toward her PLOF.     Clinical Presentation  Stable    Clinical Decision Making  Low    Rehab Potential  Good    PT Frequency  -- 3 visits    PT Treatment/Interventions  ADLs/Self Care Home Management;Cryotherapy;Electrical Stimulation;Iontophoresis /ml Dexamethasone;Moist Heat;Ultrasound;Therapeutic exercise;Therapeutic activities;Functional mobility training;Patient/family education;Manual techniques;Passive range of motion;Taping    PT Next Visit Plan  shoulder ROM and strengthening, modalities as needed    PT Home Exercise Plan  shoulder isometrics, shoulder flexion with cane, ER with cane    Consulted and Agree with Plan of Care  Patient       Patient will benefit from skilled therapeutic intervention in order to improve the following deficits and impairments:  Pain  Visit Diagnosis: Chronic left shoulder pain  Stiffness of left shoulder, not elsewhere classified     Problem List Patient Active Problem List   Diagnosis Date Noted  . Major depression 01/20/2013  . Generalized anxiety disorder 01/20/2013  . Opioid use with withdrawal (HCC) 01/20/2013  . Opioid dependence (HCC) 01/19/2013    Class: Acute    Sharmon Leyden, MPT 05/05/2018, 12:31 PM  [facility] [address] [place], [place], [address] Phone: [phone]   Fax:  [phone]  Name: AARIKA MOON MRN: [id number] Date of Birth: 13-Jan-1956

## 2018-05-17 ENCOUNTER — Ambulatory Visit: Payer: Medicaid Other | Attending: Sports Medicine | Admitting: Physical Therapy

## 2018-05-17 ENCOUNTER — Telehealth: Payer: Self-pay | Admitting: Physical Therapy

## 2018-05-17 DIAGNOSIS — G8929 Other chronic pain: Secondary | ICD-10-CM | POA: Insufficient documentation

## 2018-05-17 DIAGNOSIS — M25512 Pain in left shoulder: Secondary | ICD-10-CM | POA: Insufficient documentation

## 2018-05-17 DIAGNOSIS — M25612 Stiffness of left shoulder, not elsewhere classified: Secondary | ICD-10-CM | POA: Insufficient documentation

## 2018-05-17 NOTE — Telephone Encounter (Signed)
Left message on voicemail about missed visit.  Next visit date and time given.  Phone number given for her to call if she is unable to attend the next appointment or is no longer interested in PT.  I asked her to refer to out policy for missed visits. Liz BeachKaren Kelsea Mousel PTA

## 2018-05-20 ENCOUNTER — Ambulatory Visit: Payer: Medicaid Other | Admitting: Physical Therapy

## 2018-05-20 ENCOUNTER — Encounter: Payer: Self-pay | Admitting: Physical Therapy

## 2018-05-20 DIAGNOSIS — M25612 Stiffness of left shoulder, not elsewhere classified: Secondary | ICD-10-CM | POA: Diagnosis present

## 2018-05-20 DIAGNOSIS — M25512 Pain in left shoulder: Principal | ICD-10-CM

## 2018-05-20 DIAGNOSIS — G8929 Other chronic pain: Secondary | ICD-10-CM

## 2018-05-20 NOTE — Therapy (Signed)
[facility] [address] [place], [place], [address] Phone: [phone]   Fax:  [phone]  Physical Therapy Treatment  Patient Details  Name: Lauren Lucas MRN: [id number] Date of Birth: [birth year] Referring Provider: Elinor Dodgehristopher Brumfield, MD   Encounter Date: 05/20/2018  PT End of Session - 05/20/18 0854    Visit Number  2    Number of Visits  4    Date for PT Re-Evaluation  07/05/18    Authorization Type  recommend clinically 2x/week for 4 weeks, pt limited by insurance     PT Start Time  0845    PT Stop Time  0925    PT Time Calculation (min)  40 min       Past Medical History:  Diagnosis Date  . Anxiety   . Depression   . Hepatitis    Hep C for 10 years, asymtomatic  . Heroin addiction (HCC)   . Mitral valve prolapse     Past Surgical History:  Procedure Laterality Date  . ABDOMINAL HYSTERECTOMY    . CHOLECYSTECTOMY    . DENTAL SURGERY    . SPLENECTOMY      There were no vitals filed for this visit.  Subjective Assessment - 05/20/18 0849    Subjective  Increased pain with reaching back. I had an MRI last week. I will see MD June 18 to get the results.     Currently in Pain?  Yes    Pain Score  3     Pain Location  Shoulder    Pain Orientation  Left    Pain Descriptors / Indicators  Aching    Aggravating Factors   reaching back, reaching behind    Pain Relieving Factors  heat, shower         [facility] PT Assessment - 05/20/18 0001      AROM   Left Shoulder Flexion  98 Degrees 138 AAROM with pulleys                    [facility] Adult PT Treatment/Exercise - 05/20/18 0001      Shoulder Exercises: Supine   Other Supine Exercises  supine cane pressups, pullovers, ER       Shoulder Exercises: Standing   External Rotation  Both;10 reps;Theraband    Theraband Level (Shoulder External Rotation)  Level 1 (Yellow)    Other Standing Exercises  standing cane flexion, abduction (painful) ,  extension, and IR       Shoulder Exercises: Pulleys   Flexion  2 minutes      Shoulder Exercises: Isometric Strengthening   Flexion  5X10"    Extension  5X10"    External Rotation  5X10"    Internal Rotation  5X10"      Neck Exercises: Stretches   Upper Trapezius Stretch  2 reps;30 seconds    Levator Stretch  2 reps;30 seconds             PT Education - 05/20/18 0919    Education Details  HEP    Person(s) Educated  Patient    Methods  Explanation;Handout    Comprehension  Verbalized understanding          PT Long Term Goals - 05/05/18 1222      PT LONG TERM GOAL #1   Title  Pt will be independent with her HEP and progression.     Baseline  initial HEP issued today    Time  -- 3 visits  Status  New    Target Date  07/05/18      PT LONG TERM GOAL #2   Title  Pt will improve her active left shoulder flexion to >/= 150 degrees in order to improve functional mobility.     Baseline  AROM L shoulder flexion: 40 degrees    Status  New    Target Date  07/05/18      PT LONG TERM GOAL #3   Title  Pt will improve her active left Internal Rotation to be able to fasten and remove her bra.     Baseline  Currently reaching to L2    Status  New    Target Date  07/05/18            Plan - 05/20/18 0909    Clinical Impression Statement  Pt reports improvement in pain and function. Improved AROM. She admits to limited HEP compliance. Able to progress HEP.     PT Next Visit Plan  shoulder ROM and strengthening, modalities as needed    PT Home Exercise Plan  shoulder isometrics, shoulder flexion with cane, ER with cane, standing cane, neck stretches, yellow band ER    Consulted and Agree with Plan of Care  Patient       Patient will benefit from skilled therapeutic intervention in order to improve the following deficits and impairments:  Pain  Visit Diagnosis: Chronic left shoulder pain  Stiffness of left shoulder, not elsewhere classified     Problem  List Patient Active Problem List   Diagnosis Date Noted  . Major depression 01/20/2013  . Generalized anxiety disorder 01/20/2013  . Opioid use with withdrawal (HCC) 01/20/2013  . Opioid dependence (HCC) 01/19/2013    Class: Acute    Sherrie Mustache, PTA 05/20/2018, 9:41 AM  [facility] [address] [place], [place], [address] Phone: [phone]   Fax:  [phone]  Name: Lauren Lucas MRN: [id number] Date of Birth: [birth year]

## 2018-05-20 NOTE — Patient Instructions (Addendum)
Flexion (Eccentric) - Active-Assist (Cane)   Use unaffected arm to push affected arm forward. Avoid hiking shoulder. Keep palm relaxed. Slowly lower affected arm for 3-5 seconds, increasing use of affected arm. _10__ reps per set, _2__ sets per day, _7__ days per week Copyright  VHI. All rights reserved  Cane Exercise: Abduction   Hold cane with right hand over end, palm-up, with other hand palm-down. Move arm out from side and up by pushing with other arm. Hold _5___ seconds. Repeat ___10_ times. Do 2____ sessions per day.  http://[email or web address].exer.us/81   Copyright  VHI. All rights reserved.      Cane Exercise: Extension / Internal Rotation   Stand holding cane behind back with both hands palm-up. Slide cane up spine toward head. Hold __5__ seconds. Repeat ___10_ times. Do __2__ sessions per day.  http://[email or web address].exer.us/85   Copyright  VHI. All rights reserved.  Gilmer Mor.  Cane Exercise: Extension   Stand holding cane behind back with both hands palm-up. Lift the cane away from body. Hold __5__ seconds. Repeat __10__ times. Do __2__ sessions per day.   Levator Stretch   Grasp seat or sit on hand on side to be stretched. Turn head toward other side and look down. Use hand on head to gently stretch neck in that position. Hold __30__ seconds. Repeat on other side. Repeat __2__ times. Do __2__ sessions per day.  http://[email or web address].exer.us/30   Copyright  VHI. All rights reserved.  Side-Bending   One hand on opposite side of head, pull head to side as far as is comfortable. Stop if there is pain. Hold ___30_ seconds. Repeat with other hand to other side. Repeat __2__ times. Do __2__ sessions per day.   SHOULDER: External Rotation (Band)    Place towel between elbow and body. Keep elbow next to body. Holding band, rotate arm away from body. Hold __5_ seconds. Use ___yellow_____ band. __10-20_ reps per set, _2__ sets per day, __7_ days per week  Copyright  VHI. All rights reserved.

## 2018-05-24 ENCOUNTER — Ambulatory Visit: Payer: Medicaid Other | Admitting: Physical Therapy

## 2018-05-24 ENCOUNTER — Encounter: Payer: Self-pay | Admitting: Physical Therapy

## 2018-05-24 DIAGNOSIS — G8929 Other chronic pain: Secondary | ICD-10-CM

## 2018-05-24 DIAGNOSIS — M25512 Pain in left shoulder: Secondary | ICD-10-CM | POA: Diagnosis not present

## 2018-05-24 DIAGNOSIS — M25612 Stiffness of left shoulder, not elsewhere classified: Secondary | ICD-10-CM

## 2018-05-24 NOTE — Patient Instructions (Signed)
   Copyright  VHI. All rights reserved.  Resistive Band Rowing   With resistive band anchored in door, grasp both ends. Keeping elbows bent, pull back, squeezing shoulder blades together. Hold ___5_ seconds. Repeat _10-20___ times. Do __2__ sessions per day.  http://[email or web address].exer.us/97   Copyright  VHI. All rights reserved.   

## 2018-05-24 NOTE — Therapy (Signed)
[facility] [address] [place], [place], [address] Phone: [phone]   Fax:  [phone]  Physical Therapy Treatment  Patient Details  Name: Lauren Lucas MRN: [id number] Date of Birth: [birth year] Referring Provider: Elinor Dodge, MD   Encounter Date: 05/24/2018  PT End of Session - 05/24/18 1527    Visit Number  3    Number of Visits  4    Date for PT Re-Evaluation  07/05/18    Authorization Type  recommend clinically 2x/week for 4 weeks, pt limited by insurance     PT Start Time  0245    PT Stop Time  0330    PT Time Calculation (min)  45 min       Past Medical History:  Diagnosis Date  . Anxiety   . Depression   . Hepatitis    Hep C for 10 years, asymtomatic  . Heroin addiction (HCC)   . Mitral valve prolapse     Past Surgical History:  Procedure Laterality Date  . ABDOMINAL HYSTERECTOMY    . CHOLECYSTECTOMY    . DENTAL SURGERY    . SPLENECTOMY      There were no vitals filed for this visit.  Subjective Assessment - 05/24/18 1448    Subjective  Md said I have an acromion fracture. No lifting and continue PT     Currently in Pain?  Yes    Pain Score  4     Pain Location  Shoulder    Pain Orientation  Left    Pain Descriptors / Indicators  Aching                       [facility] Adult PT Treatment/Exercise - 05/24/18 0001      Shoulder Exercises: Supine   Protraction  10 reps cane    Other Supine Exercises  supine cane pressups, pullovers, ER     Other Supine Exercises  PROM flexion, abduction, IR, ER       Shoulder Exercises: Standing   External Rotation  Both;10 reps;Theraband    Theraband Level (Shoulder External Rotation)  Level 1 (Yellow)    Row  10 reps    Row Limitations  red    Other Standing Exercises  standing cane flexion (painful) , abduction , extension, and IR       Shoulder Exercises: Pulleys   Flexion  1 minute      Manual Therapy   Manual Therapy   Soft tissue mobilization    Soft tissue mobilization  Posterior shoulder              PT Education - 05/24/18 1525    Education Details  HEP    Person(s) Educated  Patient    Methods  Explanation;Handout    Comprehension  Verbalized understanding          PT Long Term Goals - 05/05/18 1222      PT LONG TERM GOAL #1   Title  Pt will be independent with her HEP and progression.     Baseline  initial HEP issued today    Time  -- 3 visits    Status  New    Target Date  07/05/18      PT LONG TERM GOAL #2   Title  Pt will improve her active left shoulder flexion to >/= 150 degrees in order to improve functional mobility.     Baseline  AROM L shoulder flexion: 40 degrees  Status  New    Target Date  07/05/18      PT LONG TERM GOAL #3   Title  Pt will improve her active left Internal Rotation to be able to fasten and remove her bra.     Baseline  Currently reaching to L2    Status  New    Target Date  07/05/18            Plan - 05/24/18 1526    Clinical Impression Statement  Pt reports seeing MD today who reports she has an acromion fracture. MD recommended she continue PT and avoid lifting, Progressed to scapular rows and added to HEP. Flexion AAROM exercises more painful today and she reports this is lilely due to just leaving MD office where he moved her arm around.     PT Next Visit Plan  shoulder ROM and strengthening, modalities as needed    PT Home Exercise Plan  shoulder isometrics, shoulder flexion with cane, ER with cane, standing cane, neck stretches, yellow band ER, scapular rows     Consulted and Agree with Plan of Care  Patient       Patient will benefit from skilled therapeutic intervention in order to improve the following deficits and impairments:  Pain  Visit Diagnosis: Chronic left shoulder pain  Stiffness of left shoulder, not elsewhere classified     Problem List Patient Active Problem List   Diagnosis Date Noted  . Major  depression 01/20/2013  . Generalized anxiety disorder 01/20/2013  . Opioid use with withdrawal (HCC) 01/20/2013  . Opioid dependence (HCC) 01/19/2013    Class: Acute    Sherrie MustacheDonoho, Isabella Ida McGee, PTA 05/24/2018, 4:39 PM  [facility] [address] [place], [place], [address] Phone: [phone]   Fax:  [phone]  Name: Lauren Lucas MRN: [id number] Date of Birth: 04-Oct-1956

## 2018-05-30 ENCOUNTER — Encounter: Payer: Self-pay | Admitting: Physical Therapy

## 2018-05-30 ENCOUNTER — Ambulatory Visit: Payer: Medicaid Other | Admitting: Physical Therapy

## 2018-05-30 DIAGNOSIS — M25512 Pain in left shoulder: Secondary | ICD-10-CM | POA: Diagnosis not present

## 2018-05-30 DIAGNOSIS — G8929 Other chronic pain: Secondary | ICD-10-CM

## 2018-05-30 DIAGNOSIS — M25612 Stiffness of left shoulder, not elsewhere classified: Secondary | ICD-10-CM

## 2018-05-30 NOTE — Patient Instructions (Signed)
Issued from exercise drawer: Scapular retraction 1 to 3  Or more times a day   Hold 3-5 seconds Slide shoulder blades around trunk to spine, keeping elbows low.   5 X

## 2018-05-30 NOTE — Therapy (Addendum)
[place], [place], [address] Phone: [phone]   Fax:  [phone]  Physical Therapy Treatment Discharge summary  Patient Details  Name: MARKEYA MINCY MRN: [id number] Date of Birth: [birth year] Referring Provider: Alphonzo Grieve, MD   Encounter Date: 05/30/2018  PT End of Session - 05/30/18 1638    Visit Number  4    Number of Visits  4    Date for PT Re-Evaluation  07/05/18    PT Start Time  1503    PT Stop Time  1546    PT Time Calculation (min)  43 min    Activity Tolerance  Patient tolerated treatment well    Behavior During Therapy  [facility] for tasks assessed/performed       Past Medical History:  Diagnosis Date  . Anxiety   . Depression   . Hepatitis    Hep C for 10 years, asymtomatic  . Heroin addiction ([place])   . Mitral valve prolapse     Past Surgical History:  Procedure Laterality Date  . ABDOMINAL HYSTERECTOMY    . CHOLECYSTECTOMY    . DENTAL SURGERY    . SPLENECTOMY      There were no vitals filed for this visit.  Subjective Assessment - 05/30/18 1504    Subjective  I don't always get the exercises done. The band made me sore. Heat eases pain so i can wash my hair    Currently in Pain?  Yes    Pain Score  4     Pain Location  Shoulder    Pain Orientation  Left    Pain Descriptors / Indicators  Spasm catches,,  grabs     Pain Type  Chronic pain    Pain Radiating Towards  into neck    Pain Frequency  Constant    Aggravating Factors   tenses with cold    Pain Relieving Factors  heat shower    Effect of Pain on Daily Activities  not lifting                       [facility] Adult PT Treatment/Exercise - 05/30/18 0001      Shoulder Exercises: Supine   Protraction  10 reps    Horizontal ABduction  10 reps    External Rotation  10 reps all these with cane,  cued for technique    Flexion  10 reps    ABduction  10 reps    Other Supine Exercises  Yellow  band shoulder extension 5 x 2 sets,  1 set with elbow flexed, 1 set elbow extended.        Shoulder Exercises: Pulleys   Flexion  3 minutes    Scaption  3 minutes      Shoulder Exercises: Isometric Strengthening   Flexion  5X5"    Extension  5X5"    External Rotation  5X5"    Internal Rotation  5X5"      Manual Therapy   Passive ROM  left shoulder all planes  care taken to stretch gently. Moist heat used concurrent to supine exercise and stretches              PT Education - 05/30/18 1638    Education Details  HEP    Person(s) Educated  Patient    Methods  Explanation;Demonstration;Verbal cues;Handout    Comprehension  Verbalized understanding;Returned demonstration          PT Long Term  Goals - 05/30/18 1647      PT LONG TERM GOAL #1   Title  Pt will be independent with her HEP and progression.     Baseline  non compliant    Period  Weeks    Status  On-going      PT LONG TERM GOAL #2   Title  Pt will improve her active left shoulder flexion to >/= 150 degrees in order to improve functional mobility.     Baseline  AA ROM  160 in supine    Period  Weeks    Status  On-going      PT LONG TERM GOAL #3   Title  Pt will improve her active left Internal Rotation to be able to fasten and remove her bra.     Baseline  able to do easily    Status  Achieved            Plan - 05/30/18 1639    Clinical Impression Statement  LTG#3 met. Patient has been non compliant with her HEP.  Encouragement given and importance stressed.  Patient continued with same pain 4/10 however she felt more relaxed.  AA shoulder flexion with cane 160 degrees.     PT Next Visit Plan  shoulder ROM and strengthening, modalities as needed    PT Home Exercise Plan  shoulder isometrics, shoulder flexion with cane, ER with cane, standing cane, neck stretches, yellow band ER, scapular rows shoulder retraction    Consulted and Agree with Plan of Care  Patient       Patient will benefit from  skilled therapeutic intervention in order to improve the following deficits and impairments:     Visit Diagnosis: Chronic left shoulder pain  Stiffness of left shoulder, not elsewhere classified     Problem List Patient Active Problem List   Diagnosis Date Noted  . Major depression 01/20/2013  . Generalized anxiety disorder 01/20/2013  . Opioid use with withdrawal ([place]) 01/20/2013  . Opioid dependence ([place]) 01/19/2013    Class: Acute   PHYSICAL THERAPY DISCHARGE SUMMARY  Visits from Start of Care: 4  Current functional level related to goals / functional outcomes: 2/3 LTG's not met.    Remaining deficits: 2 of LTG's not met.    Education / Equipment: HEP Plan: Patient agrees to discharge.  Patient goals were not met. Patient is being discharged due to not returning since the last visit.  ?????    Kearney Hard, PT 02/22/19 10:19 AM     HARRIS,KAREN PTA 05/30/2018, 4:49 PM  [facility] [address] [place], [place], [address] Phone: [phone]   Fax:  [phone]  Name: JAIME DOME MRN: [id number] Date of Birth: 12-05-1956

## 2018-07-21 ENCOUNTER — Other Ambulatory Visit: Payer: Self-pay

## 2018-07-21 DIAGNOSIS — Z1231 Encounter for screening mammogram for malignant neoplasm of breast: Secondary | ICD-10-CM

## 2018-07-27 DIAGNOSIS — F1111 Opioid abuse, in remission: Secondary | ICD-10-CM | POA: Insufficient documentation

## 2018-08-23 ENCOUNTER — Ambulatory Visit: Payer: Self-pay

## 2019-04-13 ENCOUNTER — Other Ambulatory Visit: Payer: Self-pay | Admitting: Orthopaedic Surgery

## 2019-04-13 DIAGNOSIS — S52022A Displaced fracture of olecranon process without intraarticular extension of left ulna, initial encounter for closed fracture: Secondary | ICD-10-CM

## 2019-04-14 ENCOUNTER — Ambulatory Visit
Admission: RE | Admit: 2019-04-14 | Discharge: 2019-04-14 | Disposition: A | Discharge status: Home / Self Care | Payer: Medicaid Other | Source: Ambulatory Visit | Attending: Orthopaedic Surgery | Admitting: Orthopaedic Surgery

## 2019-04-14 ENCOUNTER — Encounter (HOSPITAL_COMMUNITY)
Admission: RE | Admit: 2019-04-14 | Discharge: 2019-04-14 | Disposition: A | Discharge status: Home / Self Care | Payer: Medicaid Other | Source: Ambulatory Visit | Attending: Orthopaedic Surgery | Admitting: Orthopaedic Surgery

## 2019-04-14 ENCOUNTER — Encounter (HOSPITAL_COMMUNITY): Payer: Self-pay

## 2019-04-14 ENCOUNTER — Other Ambulatory Visit: Payer: Self-pay

## 2019-04-14 ENCOUNTER — Other Ambulatory Visit (HOSPITAL_COMMUNITY)
Admission: RE | Admit: 2019-04-14 | Discharge: 2019-04-14 | Disposition: A | Discharge status: Home / Self Care | Payer: Medicaid Other | Source: Ambulatory Visit | Attending: Orthopaedic Surgery | Admitting: Orthopaedic Surgery

## 2019-04-14 DIAGNOSIS — Z1159 Encounter for screening for other viral diseases: Secondary | ICD-10-CM | POA: Insufficient documentation

## 2019-04-14 DIAGNOSIS — Z01812 Encounter for preprocedural laboratory examination: Secondary | ICD-10-CM | POA: Insufficient documentation

## 2019-04-14 DIAGNOSIS — S52022A Displaced fracture of olecranon process without intraarticular extension of left ulna, initial encounter for closed fracture: Secondary | ICD-10-CM

## 2019-04-14 DIAGNOSIS — X58XXXA Exposure to other specified factors, initial encounter: Secondary | ICD-10-CM | POA: Diagnosis not present

## 2019-04-14 HISTORY — DX: Other specified postprocedural states: Z98.890

## 2019-04-14 HISTORY — DX: Other complications of anesthesia, initial encounter: T88.59XA

## 2019-04-14 HISTORY — DX: Adverse effect of unspecified anesthetic, initial encounter: T41.45XA

## 2019-04-14 HISTORY — DX: Nausea with vomiting, unspecified: R11.2

## 2019-04-14 LAB — CBC
HCT: 34.1 % — ABNORMAL LOW (ref 36.0–46.0)
Hemoglobin: 10.8 g/dL — ABNORMAL LOW (ref 12.0–15.0)
MCH: 28.9 pg (ref 26.0–34.0)
MCHC: 31.7 g/dL (ref 30.0–36.0)
MCV: 91.2 fL (ref 80.0–100.0)
Platelets: 427 10*3/uL — ABNORMAL HIGH (ref 150–400)
RBC: 3.74 MIL/uL — ABNORMAL LOW (ref 3.87–5.11)
RDW: 13.5 % (ref 11.5–15.5)
WBC: 11.1 10*3/uL — ABNORMAL HIGH (ref 4.0–10.5)
nRBC: 0 % (ref 0.0–0.2)

## 2019-04-14 LAB — COMPREHENSIVE METABOLIC PANEL
ALT: 49 U/L — ABNORMAL HIGH (ref 0–44)
AST: 65 U/L — ABNORMAL HIGH (ref 15–41)
Albumin: 3.5 g/dL (ref 3.5–5.0)
Alkaline Phosphatase: 47 U/L (ref 38–126)
Anion gap: 9 (ref 5–15)
BUN: 11 mg/dL (ref 8–23)
CO2: 27 mmol/L (ref 22–32)
Calcium: 9.1 mg/dL (ref 8.9–10.3)
Chloride: 105 mmol/L (ref 98–111)
Creatinine, Ser: 0.68 mg/dL (ref 0.44–1.00)
GFR calc Af Amer: >60 mL/min (ref >60)
GFR calc non Af Amer: >60 mL/min (ref >60)
Glucose, Bld: 109 mg/dL — ABNORMAL HIGH (ref 70–99)
Potassium: 3.6 mmol/L (ref 3.5–5.1)
Sodium: 141 mmol/L (ref 135–145)
Total Bilirubin: 0.4 mg/dL (ref 0.3–1.2)
Total Protein: 7.4 g/dL (ref 6.5–8.1)

## 2019-04-14 NOTE — Patient Instructions (Addendum)
Lauren Lucas  04/14/2019   Your procedure is scheduled on: Wednesday 04/19/2019  Report to [facility] Main  Entrance              Report to admitting at  0645  AM    Call this number if you have problems the morning of surgery [phone]    Remember: Do not eat food :After Midnight.   NO SOLID FOOD AFTER MIDNIGHT THE NIGHT PRIOR TO SURGERY. NOTHING BY MOUTH EXCEPT CLEAR LIQUIDS UNTIL 3 HOURS PRIOR TO SCHEULED SURGERY. PLEASE FINISH ENSURE DRINK PER SURGEON ORDER 3 HOURS PRIOR TO SCHEDULED SURGERY TIME WHICH NEEDS TO BE COMPLETED AT   0430 am.   CLEAR LIQUID DIET   Foods Allowed                                                                     Foods Excluded  Coffee and tea, regular and decaf                             liquids that you cannot  Plain Jell-O in any flavor                                             see through such as: Fruit ices (not with fruit pulp)                                     milk, soups, orange juice  Iced Popsicles                                    All solid food Carbonated beverages, regular and diet                                    Cranberry, grape and apple juices Sports drinks like Gatorade Lightly seasoned clear broth or consume(fat free) Sugar, honey syrup  Sample Menu Breakfast                                Lunch                                     Supper Cranberry juice                    Beef broth                            Chicken broth Jell-O  Grape juice                           Apple juice Coffee or tea                        Jell-O                                      Popsicle                                                Coffee or tea                        Coffee or tea  _____________________________________________________________________               BRUSH YOUR TEETH MORNING OF SURGERY AND RINSE YOUR MOUTH OUT, NO CHEWING GUM CANDY OR MINTS.     Take  these medicines the morning of surgery with A SIP OF WATER: Citalopram (Celexa)                                 You may not have any metal on your body including hair pins and              piercings  Do not wear jewelry, make-up, lotions, powders or perfumes, deodorant             Do not wear nail polish.  Do not shave  48 hours prior to surgery.               Do not bring valuables to the hospital. [facility] IS NOT             RESPONSIBLE   FOR VALUABLES.  Contacts, dentures or bridgework may not be worn into surgery.  Leave suitcase in the car. After surgery it may be brought to your room.                   Please read over the following fact sheets you were given: _____________________________________________________________________             [facility] - Preparing for Surgery Before surgery, you can play an important role.  Because skin is not sterile, your skin needs to be as free of germs as possible.  You can reduce the number of germs on your skin by washing with CHG (chlorahexidine gluconate) soap before surgery.  CHG is an antiseptic cleaner which kills germs and bonds with the skin to continue killing germs even after washing. Please DO NOT use if you have an allergy to CHG or antibacterial soaps.  If your skin becomes reddened/irritated stop using the CHG and inform your nurse when you arrive at Short Stay. Do not shave (including legs and underarms) for at least 48 hours prior to the first CHG shower.  You may shave your face/neck. Please follow these instructions carefully:  1.  Shower with CHG Soap the night before surgery and the  morning of Surgery.  2.  If you choose to wash your hair, wash your hair first as usual with your  normal  shampoo.  3.  After you shampoo, rinse your hair and body thoroughly to remove the  shampoo.                           4.  Use CHG as you would any other liquid soap.  You can apply chg directly  to the skin and wash                        Gently with a scrungie or clean washcloth.  5.  Apply the CHG Soap to your body ONLY FROM THE NECK DOWN.   Do not use on face/ open                           Wound or open sores. Avoid contact with eyes, ears mouth and genitals (private parts).                       Wash face,  Genitals (private parts) with your normal soap.             6.  Wash thoroughly, paying special attention to the area where your surgery  will be performed.  7.  Thoroughly rinse your body with warm water from the neck down.  8.  DO NOT shower/wash with your normal soap after using and rinsing off  the CHG Soap.                9.  Pat yourself dry with a clean towel.            10.  Wear clean pajamas.            11.  Place clean sheets on your bed the night of your first shower and do not  sleep with pets. Day of Surgery : Do not apply any lotions/deodorants the morning of surgery.  Please wear clean clothes to the hospital/surgery center.  FAILURE TO FOLLOW THESE INSTRUCTIONS MAY RESULT IN THE CANCELLATION OF YOUR SURGERY PATIENT SIGNATURE_________________________________  NURSE SIGNATURE__________________________________  ________________________________________________________________________   Rogelia MireIncentive Spirometer  An incentive spirometer is a tool that can help keep your lungs clear and active. This tool measures how well you are filling your lungs with each breath. Taking long deep breaths may help reverse or decrease the chance of developing breathing (pulmonary) problems (especially infection) following:  A long period of time when you are unable to move or be active. BEFORE THE PROCEDURE   If the spirometer includes an indicator to show your best effort, your nurse or respiratory therapist will set it to a desired goal.  If possible, sit up straight or lean slightly forward. Try not to slouch.  Hold the incentive spirometer in an upright position. INSTRUCTIONS FOR USE  1. Sit on the edge of your  bed if possible, or sit up as far as you can in bed or on a chair. 2. Hold the incentive spirometer in an upright position. 3. Breathe out normally. 4. Place the mouthpiece in your mouth and seal your lips tightly around it. 5. Breathe in slowly and as deeply as possible, raising the piston or the ball toward the top of the column. 6. Hold your breath for 3-5 seconds or for as long as possible. Allow the piston or ball to fall to the bottom of the column. 7. Remove the mouthpiece from your mouth and  breathe out normally. 8. Rest for a few seconds and repeat Steps 1 through 7 at least 10 times every 1-2 hours when you are awake. Take your time and take a few normal breaths between deep breaths. 9. The spirometer may include an indicator to show your best effort. Use the indicator as a goal to work toward during each repetition. 10. After each set of 10 deep breaths, practice coughing to be sure your lungs are clear. If you have an incision (the cut made at the time of surgery), support your incision when coughing by placing a pillow or rolled up towels firmly against it. Once you are able to get out of bed, walk around indoors and cough well. You may stop using the incentive spirometer when instructed by your caregiver.  RISKS AND COMPLICATIONS  Take your time so you do not get dizzy or light-headed.  If you are in pain, you may need to take or ask for pain medication before doing incentive spirometry. It is harder to take a deep breath if you are having pain. AFTER USE  Rest and breathe slowly and easily.  It can be helpful to keep track of a log of your progress. Your caregiver can provide you with a simple table to help with this. If you are using the spirometer at home, follow these instructions: SEEK MEDICAL CARE IF:   You are having difficultly using the spirometer.  You have trouble using the spirometer as often as instructed.  Your pain medication is not giving enough relief while  using the spirometer.  You develop fever of 100.5 F (38.1 C) or higher. SEEK IMMEDIATE MEDICAL CARE IF:   You cough up bloody sputum that had not been present before.  You develop fever of 102 F (38.9 C) or greater.  You develop worsening pain at or near the incision site. MAKE SURE YOU:   Understand these instructions.  Will watch your condition.  Will get help right away if you are not doing well or get worse. Document Released: 04/05/2007 Document Revised: 02/15/2012 Document Reviewed: 06/06/2007 [facility] Patient Information 2014 [place], [place].   ________________________________________________________________________

## 2019-04-16 LAB — NOVEL CORONAVIRUS, NAA (HOSP ORDER, SEND-OUT TO REF LAB; TAT 18-24 HRS): SARS-CoV-2, NAA: NOT DETECTED

## 2019-04-18 NOTE — Progress Notes (Signed)
Anesthesia Chart Review   Case:  [id number] Date/Time:  04/19/19 0830   Procedure:  OPEN REDUCTION INTERNAL FIXATION (ORIF)LEFT ELBOW/OLECRANON FRACTURE, DISTAL TRICEPS TENDON REPAIR (Left )   Anesthesia type:  Choice   Pre-op diagnosis:  left elbow olecranon fracture and distal tricep rupture   Location:  [facility] ROOM 05 / [facility]   Surgeon:  Bjorn Pippin, MD      DISCUSSION:63 yo never smoker with h/o PONV, anxiety, depression, MVP, Hepatitis C (untreated), h/o heroin use, left elbow olecranon fracture and distal tricep rupture scheduled for above procedure 04/19/2019 with Dr. Ramond Marrow.   Last seen by PCP, Misty Stanley, PA-C, 08/04/18.  Per note pt has h/o MVP with systolic murmer heard on exam , is asymptomatic.  Referral to cardiology has been made.   Negative COVID19 swab 04/14/2019.   Discussed with Dr. Acey Lav, ok to proceed with planned procedure barring acute status change.  VS: BP 136/67   Pulse 79   Temp 36.7 C (Oral)   Resp 16   Ht 5\' 2"  (1.575 m)   Wt 52.6 kg   SpO2 98%   BMI 21.22 kg/m   PROVIDERS: Bryon Lions, PA-C is PCP    LABS: Labs reviewed: Acceptable for surgery. (all labs ordered are listed, but only abnormal results are displayed)  Labs Reviewed  COMPREHENSIVE METABOLIC PANEL - Abnormal; Notable for the following components:      Result Value   Glucose, Bld 109 (*)    AST 65 (*)    ALT 49 (*)    All other components within normal limits  CBC - Abnormal; Notable for the following components:   WBC 11.1 (*)    RBC 3.74 (*)    Hemoglobin 10.8 (*)    HCT 34.1 (*)    Platelets 427 (*)    All other components within normal limits     IMAGES: Left Elbow CT 04/14/2019 FINDINGS: There is a comminuted impacted fracture of the olecranon process. There are 3 major fragments of the articular surface of the olecranon, best seen on image 28 of series 603.  Hemarthrosis. The distal humerus and proximal radius are intact. The radial head is not  dislocated.  3D images were reconstructed and are available for review which better demonstrates the relationship of the multiple olecranon fracture fragments to 1 another.  IMPRESSION: Comminuted fracture of the proximal ulna as described  EKG:   CV:  Past Medical History:  Diagnosis Date  . Anxiety   . Complication of anesthesia   . Depression   . Hepatitis    Hep C for 10 years, asymtomatic- has not received treatment  . Heroin addiction (HCC)   . Mitral valve prolapse   . PONV (postoperative nausea and vomiting)     Past Surgical History:  Procedure Laterality Date  . ABDOMINAL HYSTERECTOMY    . CHOLECYSTECTOMY    . DENTAL SURGERY    . SPLENECTOMY      MEDICATIONS: . Ca Carbonate-Mag Hydroxide (ROLAIDS PO)  . citalopram (CELEXA) 20 MG tablet  . fluticasone (FLONASE) 50 MCG/ACT nasal spray  . ibuprofen (ADVIL,MOTRIN) 200 MG tablet  . neomycin-bacitracin-polymyxin (NEOSPORIN) ointment  . sodium chloride (OCEAN) 0.65 % SOLN nasal spray  . traZODone (DESYREL) 100 MG tablet   No current facility-administered medications for this encounter.    Janey Genta [facility] Pre-Surgical Testing [phone] 04/18/19 11:32 AM

## 2019-04-18 NOTE — Anesthesia Preprocedure Evaluation (Addendum)
Anesthesia Evaluation  Patient identified by MRN, date of birth, ID band Patient awake    Reviewed: Allergy & Precautions, H&P , NPO status , Patient's Chart, lab work & pertinent test results  History of Anesthesia Complications (+) PONV and history of anesthetic complications  Airway Mallampati: II   Neck ROM: full    Dental   Pulmonary neg pulmonary ROS,    breath sounds clear to auscultation       Cardiovascular + Valvular Problems/Murmurs MVP  Rhythm:regular Rate:Normal     Neuro/Psych Anxiety Depression negative neurological ROS     GI/Hepatic negative GI ROS, (+) Hepatitis -, CHx of heroin abuse   Endo/Other  negative endocrine ROS  Renal/GU negative Renal ROS     Musculoskeletal negative musculoskeletal ROS (+)   Abdominal   Peds  Hematology negative hematology ROS (+)   Anesthesia Other Findings Day of surgery medications reviewed with the patient.  Reproductive/Obstetrics                            Anesthesia Physical Anesthesia Plan  ASA: II  Anesthesia Plan: General   Post-op Pain Management: GA combined w/ Regional for post-op pain   Induction: Intravenous  PONV Risk Score and Plan: 4 or greater and Treatment may vary due to age or medical condition, Ondansetron, Dexamethasone and Midazolam  Airway Management Planned: Oral ETT  Additional Equipment:   Intra-op Plan:   Post-operative Plan: Extubation in OR  Informed Consent: I have reviewed the patients History and Physical, chart, labs and discussed the procedure including the risks, benefits and alternatives for the proposed anesthesia with the patient or authorized representative who has indicated his/her understanding and acceptance.       Plan Discussed with: CRNA, Anesthesiologist and Surgeon  Anesthesia Plan Comments: (See PAT note 04/14/2019, Jodell Cipro, PA-C)      Anesthesia Quick  Evaluation

## 2019-04-19 ENCOUNTER — Ambulatory Visit (HOSPITAL_COMMUNITY)
Admission: RE | Admit: 2019-04-19 | Discharge: 2019-04-19 | Disposition: A | Discharge status: Home / Self Care | Payer: Medicaid Other | Attending: Orthopaedic Surgery | Admitting: Orthopaedic Surgery

## 2019-04-19 ENCOUNTER — Ambulatory Visit (HOSPITAL_COMMUNITY): Payer: Medicaid Other | Admitting: Physician Assistant

## 2019-04-19 ENCOUNTER — Other Ambulatory Visit: Payer: Self-pay

## 2019-04-19 ENCOUNTER — Encounter (HOSPITAL_COMMUNITY): Payer: Self-pay | Admitting: *Deleted

## 2019-04-19 ENCOUNTER — Encounter (HOSPITAL_COMMUNITY)
Admission: RE | Disposition: A | Discharge status: Home / Self Care | Payer: Self-pay | Source: Home / Self Care | Attending: Orthopaedic Surgery

## 2019-04-19 DIAGNOSIS — F1120 Opioid dependence, uncomplicated: Secondary | ICD-10-CM | POA: Diagnosis not present

## 2019-04-19 DIAGNOSIS — Z8249 Family history of ischemic heart disease and other diseases of the circulatory system: Secondary | ICD-10-CM | POA: Diagnosis not present

## 2019-04-19 DIAGNOSIS — I341 Nonrheumatic mitral (valve) prolapse: Secondary | ICD-10-CM | POA: Diagnosis not present

## 2019-04-19 DIAGNOSIS — F419 Anxiety disorder, unspecified: Secondary | ICD-10-CM | POA: Insufficient documentation

## 2019-04-19 DIAGNOSIS — Z79899 Other long term (current) drug therapy: Secondary | ICD-10-CM | POA: Insufficient documentation

## 2019-04-19 DIAGNOSIS — B1920 Unspecified viral hepatitis C without hepatic coma: Secondary | ICD-10-CM | POA: Insufficient documentation

## 2019-04-19 DIAGNOSIS — F329 Major depressive disorder, single episode, unspecified: Secondary | ICD-10-CM | POA: Insufficient documentation

## 2019-04-19 DIAGNOSIS — Z791 Long term (current) use of non-steroidal anti-inflammatories (NSAID): Secondary | ICD-10-CM | POA: Diagnosis not present

## 2019-04-19 DIAGNOSIS — S52022A Displaced fracture of olecranon process without intraarticular extension of left ulna, initial encounter for closed fracture: Secondary | ICD-10-CM | POA: Insufficient documentation

## 2019-04-19 DIAGNOSIS — Z7951 Long term (current) use of inhaled steroids: Secondary | ICD-10-CM | POA: Insufficient documentation

## 2019-04-19 DIAGNOSIS — X58XXXA Exposure to other specified factors, initial encounter: Secondary | ICD-10-CM | POA: Diagnosis not present

## 2019-04-19 DIAGNOSIS — S46319A Strain of muscle, fascia and tendon of triceps, unspecified arm, initial encounter: Secondary | ICD-10-CM | POA: Insufficient documentation

## 2019-04-19 HISTORY — PX: ORIF ELBOW FRACTURE: SHX5031

## 2019-04-19 SURGERY — OPEN REDUCTION INTERNAL FIXATION (ORIF) ELBOW/OLECRANON FRACTURE
Anesthesia: General | Site: Arm Lower | Laterality: Left

## 2019-04-19 MED ORDER — SUGAMMADEX SODIUM 200 MG/2ML IV SOLN
INTRAVENOUS | Status: AC
  Filled 2019-04-19: qty 2

## 2019-04-19 MED ORDER — ROCURONIUM BROMIDE 10 MG/ML (PF) SYRINGE
PREFILLED_SYRINGE | INTRAVENOUS | Status: DC | PRN
  Administered 2019-04-19: 40 mg via INTRAVENOUS

## 2019-04-19 MED ORDER — FENTANYL CITRATE (PF) 250 MCG/5ML IJ SOLN
INTRAMUSCULAR | Status: DC | PRN
  Administered 2019-04-19 (×2): 50 ug via INTRAVENOUS

## 2019-04-19 MED ORDER — ROCURONIUM BROMIDE 10 MG/ML (PF) SYRINGE
PREFILLED_SYRINGE | INTRAVENOUS | Status: AC
  Filled 2019-04-19: qty 10

## 2019-04-19 MED ORDER — TOBRAMYCIN SULFATE 1.2 G IJ SOLR
INTRAMUSCULAR | Status: DC | PRN
  Administered 2019-04-19: 1.2 g via TOPICAL

## 2019-04-19 MED ORDER — BUPIVACAINE HCL (PF) 0.5 % IJ SOLN
INTRAMUSCULAR | Status: AC
  Filled 2019-04-19: qty 30

## 2019-04-19 MED ORDER — LIDOCAINE 2% (20 MG/ML) 5 ML SYRINGE
INTRAMUSCULAR | Status: DC | PRN
  Administered 2019-04-19: 40 mg via INTRAVENOUS

## 2019-04-19 MED ORDER — EPHEDRINE SULFATE-NACL 50-0.9 MG/10ML-% IV SOSY
PREFILLED_SYRINGE | INTRAVENOUS | Status: DC | PRN
  Administered 2019-04-19 (×4): 5 mg via INTRAVENOUS

## 2019-04-19 MED ORDER — VANCOMYCIN HCL 1000 MG IV SOLR
INTRAVENOUS | Status: DC | PRN
  Administered 2019-04-19: 1000 mg via TOPICAL

## 2019-04-19 MED ORDER — CHLORHEXIDINE GLUCONATE 4 % EX LIQD: 60.0000 mL | Freq: Once | CUTANEOUS | Status: DC

## 2019-04-19 MED ORDER — 0.9 % SODIUM CHLORIDE (POUR BTL) OPTIME
TOPICAL | Status: DC | PRN
  Administered 2019-04-19: 1000 mL

## 2019-04-19 MED ORDER — OMEPRAZOLE 20 MG PO CPDR: 20.0000 mg | DELAYED_RELEASE_CAPSULE | Freq: Every day | ORAL | 0 refills | Status: AC

## 2019-04-19 MED ORDER — FENTANYL CITRATE (PF) 100 MCG/2ML IJ SOLN
INTRAMUSCULAR | Status: AC
  Administered 2019-04-19: 100 ug
  Filled 2019-04-19: qty 2

## 2019-04-19 MED ORDER — EPHEDRINE 5 MG/ML INJ
INTRAVENOUS | Status: AC
  Filled 2019-04-19: qty 10

## 2019-04-19 MED ORDER — PROPOFOL 10 MG/ML IV BOLUS
INTRAVENOUS | Status: AC
  Filled 2019-04-19: qty 40

## 2019-04-19 MED ORDER — TOBRAMYCIN SULFATE 1.2 G IJ SOLR
INTRAMUSCULAR | Status: AC
  Filled 2019-04-19: qty 1.2

## 2019-04-19 MED ORDER — FENTANYL CITRATE (PF) 100 MCG/2ML IJ SOLN
INTRAMUSCULAR | Status: AC
  Filled 2019-04-19: qty 2

## 2019-04-19 MED ORDER — SUGAMMADEX SODIUM 200 MG/2ML IV SOLN
INTRAVENOUS | Status: AC
  Filled 2019-04-19: qty 4

## 2019-04-19 MED ORDER — SUGAMMADEX SODIUM 200 MG/2ML IV SOLN
INTRAVENOUS | Status: DC | PRN
  Administered 2019-04-19: 150 mg via INTRAVENOUS

## 2019-04-19 MED ORDER — LIDOCAINE 2% (20 MG/ML) 5 ML SYRINGE
INTRAMUSCULAR | Status: AC
  Filled 2019-04-19: qty 5

## 2019-04-19 MED ORDER — BUPIVACAINE LIPOSOME 1.3 % IJ SUSP: INTRAMUSCULAR | Status: DC | PRN

## 2019-04-19 MED ORDER — PROPOFOL 10 MG/ML IV BOLUS
INTRAVENOUS | Status: AC
  Filled 2019-04-19: qty 20

## 2019-04-19 MED ORDER — VANCOMYCIN HCL 1000 MG IV SOLR
INTRAVENOUS | Status: AC
  Filled 2019-04-19: qty 1000

## 2019-04-19 MED ORDER — BUPIVACAINE LIPOSOME 1.3 % IJ SUSP
INTRAMUSCULAR | Status: DC | PRN
  Administered 2019-04-19: 10 mL via PERINEURAL

## 2019-04-19 MED ORDER — MELOXICAM 7.5 MG PO TABS: 7.5000 mg | ORAL_TABLET | Freq: Every day | ORAL | 0 refills | Status: AC

## 2019-04-19 MED ORDER — ONDANSETRON HCL 4 MG PO TABS: 4.0000 mg | ORAL_TABLET | Freq: Three times a day (TID) | ORAL | 1 refills | Status: AC | PRN

## 2019-04-19 MED ORDER — PROMETHAZINE HCL 25 MG/ML IJ SOLN: 6.2500 mg | INTRAMUSCULAR | Status: DC | PRN

## 2019-04-19 MED ORDER — DEXAMETHASONE SODIUM PHOSPHATE 10 MG/ML IJ SOLN
INTRAMUSCULAR | Status: DC | PRN
  Administered 2019-04-19: 10 mg via INTRAVENOUS

## 2019-04-19 MED ORDER — LACTATED RINGERS IV SOLN
INTRAVENOUS | Status: DC
  Administered 2019-04-19 (×2): via INTRAVENOUS

## 2019-04-19 MED ORDER — FENTANYL CITRATE (PF) 100 MCG/2ML IJ SOLN: 25.0000 ug | INTRAMUSCULAR | Status: DC | PRN

## 2019-04-19 MED ORDER — BUPIVACAINE HCL (PF) 0.25 % IJ SOLN
INTRAMUSCULAR | Status: DC | PRN
  Administered 2019-04-19: 10 mL

## 2019-04-19 MED ORDER — BUPIVACAINE HCL 0.25 % IJ SOLN
INTRAMUSCULAR | Status: AC
  Filled 2019-04-19: qty 1

## 2019-04-19 MED ORDER — MIDAZOLAM HCL 2 MG/2ML IJ SOLN
INTRAMUSCULAR | Status: AC
  Administered 2019-04-19: 2 mg
  Filled 2019-04-19: qty 2

## 2019-04-19 MED ORDER — ONDANSETRON HCL 4 MG/2ML IJ SOLN
INTRAMUSCULAR | Status: AC
  Filled 2019-04-19: qty 2

## 2019-04-19 MED ORDER — OMEPRAZOLE 20 MG PO CPDR: 20.0000 mg | DELAYED_RELEASE_CAPSULE | Freq: Every day | ORAL | 0 refills | Status: DC

## 2019-04-19 MED ORDER — CEFAZOLIN SODIUM-DEXTROSE 2-4 GM/100ML-% IV SOLN
INTRAVENOUS | Status: AC
  Filled 2019-04-19: qty 100

## 2019-04-19 MED ORDER — ACETAMINOPHEN 10 MG/ML IV SOLN: 1000.0000 mg | Freq: Once | INTRAVENOUS | Status: DC | PRN

## 2019-04-19 MED ORDER — CEFAZOLIN SODIUM-DEXTROSE 2-4 GM/100ML-% IV SOLN
2.0000 g | INTRAVENOUS | Status: AC
  Administered 2019-04-19: 2 g via INTRAVENOUS

## 2019-04-19 MED ORDER — SUCCINYLCHOLINE CHLORIDE 200 MG/10ML IV SOSY
PREFILLED_SYRINGE | INTRAVENOUS | Status: AC
  Filled 2019-04-19: qty 10

## 2019-04-19 MED ORDER — TRAMADOL HCL 50 MG PO TABS: 50.0000 mg | ORAL_TABLET | Freq: Four times a day (QID) | ORAL | 0 refills | Status: AC | PRN

## 2019-04-19 MED ORDER — DEXAMETHASONE SODIUM PHOSPHATE 10 MG/ML IJ SOLN
INTRAMUSCULAR | Status: AC
  Filled 2019-04-19: qty 1

## 2019-04-19 MED ORDER — PROPOFOL 10 MG/ML IV BOLUS
INTRAVENOUS | Status: DC | PRN
  Administered 2019-04-19: 90 mg via INTRAVENOUS

## 2019-04-19 MED ORDER — ONDANSETRON HCL 4 MG/2ML IJ SOLN
INTRAMUSCULAR | Status: DC | PRN
  Administered 2019-04-19: 4 mg via INTRAVENOUS

## 2019-04-19 MED ORDER — MIDAZOLAM HCL 2 MG/2ML IJ SOLN
INTRAMUSCULAR | Status: AC
  Filled 2019-04-19: qty 2

## 2019-04-19 SURGICAL SUPPLY — 62 items
BANDAGE ACE 6X5 VEL STRL LF (GAUZE/BANDAGES/DRESSINGS) ×4 IMPLANT
BIT DRILL 1.8 CANN MAX VPC (BIT) ×2 IMPLANT
BIT DRILL 2.5X2.75 QC CALB (BIT) ×2 IMPLANT
BIT DRILL CALIBRATED 2.7 (BIT) ×2 IMPLANT
BLADE HEX COATED 2.75 (ELECTRODE) ×2 IMPLANT
BLADE SURG 15 STRL LF DISP TIS (BLADE) ×1 IMPLANT
BLADE SURG 15 STRL SS (BLADE) ×1
BLADE SURG SZ10 CARB STEEL (BLADE) ×2 IMPLANT
BNDG ESMARK 4X9 LF (GAUZE/BANDAGES/DRESSINGS) ×2 IMPLANT
CHLORAPREP W/TINT 26 (MISCELLANEOUS) ×2 IMPLANT
CLSR STERI-STRIP ANTIMIC 1/2X4 (GAUZE/BANDAGES/DRESSINGS) ×2 IMPLANT
CUFF TOURN SGL QUICK 18X4 (TOURNIQUET CUFF) ×2 IMPLANT
DRAPE EXTREMITY T 121X128X90 (DISPOSABLE) ×2 IMPLANT
DRAPE INCISE IOBAN 66X45 STRL (DRAPES) ×2 IMPLANT
DRAPE OEC MINIVIEW 54X84 (DRAPES) ×2 IMPLANT
DRAPE SHEET LG 3/4 BI-LAMINATE (DRAPES) ×2 IMPLANT
DRAPE SURG IRRIG POUCH 19X23 (DRAPES) ×2 IMPLANT
DRAPE U-SHAPE 47X51 STRL (DRAPES) ×2 IMPLANT
DRSG AQUACEL AG ADV 3.5X 6 (GAUZE/BANDAGES/DRESSINGS) ×2 IMPLANT
ELECT PENCIL ROCKER SW 15FT (MISCELLANEOUS) ×2 IMPLANT
ELECT REM PT RETURN 15FT ADLT (MISCELLANEOUS) ×2 IMPLANT
GLOVE BIOGEL PI IND STRL 8 (GLOVE) ×1 IMPLANT
GLOVE BIOGEL PI INDICATOR 8 (GLOVE) ×1
GLOVE ECLIPSE 8.0 STRL XLNG CF (GLOVE) ×2 IMPLANT
GOWN STRL REUS W/ TWL LRG LVL3 (GOWN DISPOSABLE) ×1 IMPLANT
GOWN STRL REUS W/TWL LRG LVL3 (GOWN DISPOSABLE) ×1
GOWN STRL REUS W/TWL XL LVL3 (GOWN DISPOSABLE) ×2 IMPLANT
K-WIRE COCR 0.9X95 (WIRE) ×2
K-WIRE DBL TROCAR .045X4 ×4 IMPLANT
K-WIRE FIXATION 2.0X6 (WIRE) ×4
KIT BASIN OR (CUSTOM PROCEDURE TRAY) ×2 IMPLANT
KIT ORTHOSORB 1-PIN  2.0X40 (Joint) ×1 IMPLANT
KIT ORTHOSORB 1-PIN 2.0X40 (Joint) ×1 IMPLANT
KWIRE COCR 0.9X95 (WIRE) ×1 IMPLANT
KWIRE DBL TROCAR .045X4 ×1 IMPLANT
KWIRE FIXATION 2.0X6 (WIRE) ×2 IMPLANT
NS IRRIG 1000ML POUR BTL (IV SOLUTION) ×2 IMPLANT
PACK ARTHROSCOPY WL (CUSTOM PROCEDURE TRAY) ×2 IMPLANT
PAD CAST 4YDX4 CTTN HI CHSV (CAST SUPPLIES) ×2 IMPLANT
PADDING CAST COTTON 4X4 STRL (CAST SUPPLIES) ×2
PLATE OLECRANON SM (Plate) ×2 IMPLANT
SCREW CORTICAL LOW PROF 3.5X20 (Screw) ×4 IMPLANT
SCREW LOCK CORT STAR 3.5X10 (Screw) ×2 IMPLANT
SCREW LOCK CORT STAR 3.5X14 (Screw) ×2 IMPLANT
SCREW LOCK CORT STAR 3.5X20 (Screw) ×2 IMPLANT
SCREW LOCK CORT STAR 3.5X50 (Screw) ×2 IMPLANT
SCREW LOW PROFILE 3.5MMX42 (Screw) ×2 IMPLANT
SCREW MAX VPC 2.5MMX26MM (Screw) ×2 IMPLANT
SCREW NON LOCKING LP 3.5 16MM (Screw) ×2 IMPLANT
SLING ARM FOAM STRAP SML (SOFTGOODS) ×2 IMPLANT
SPONGE LAP 18X18 RF (DISPOSABLE) ×4 IMPLANT
SUCTION FRAZIER HANDLE 12FR (TUBING) ×1
SUCTION TUBE FRAZIER 12FR DISP (TUBING) ×1 IMPLANT
SUT BROADBAND TAPE 2PK 2.3 (SUTURE) ×4 IMPLANT
SUT MNCRL AB 4-0 PS2 18 (SUTURE) ×2 IMPLANT
SUT VIC AB 0 CT1 27 (SUTURE) ×2
SUT VIC AB 0 CT1 27XBRD ANBCTR (SUTURE) ×2 IMPLANT
SYR BULB IRRIGATION 50ML (SYRINGE) ×2 IMPLANT
TOWEL OR 17X26 10 PK STRL BLUE (TOWEL DISPOSABLE) ×2 IMPLANT
TOWEL OR NON WOVEN STRL DISP B (DISPOSABLE) ×2 IMPLANT
WASHER 3.5MM (Orthopedic Implant) ×2 IMPLANT
YANKAUER SUCT BULB TIP NO VENT (SUCTIONS) ×2 IMPLANT

## 2019-04-19 NOTE — Anesthesia Procedure Notes (Signed)
Date/Time: 04/19/2019 11:03 AM Performed by: Lavina Hamman, CRNA Pre-anesthesia Checklist: Patient identified, Emergency Drugs available, Suction available, Patient being monitored and Timeout performed Patient Re-evaluated:Patient Re-evaluated prior to induction Oxygen Delivery Method: Circle system utilized Preoxygenation: Pre-oxygenation with 100% oxygen Induction Type: IV induction and Cricoid Pressure applied Laryngoscope Size: Mac and 3 Grade View: Grade I Tube size: 7.0 mm Number of attempts: 1 Airway Equipment and Method: Stylet Placement Confirmation: positive ETCO2,  breath sounds checked- equal and bilateral and ETT inserted through vocal cords under direct vision Secured at: 21 cm Tube secured with: Tape Dental Injury: Teeth and Oropharynx as per pre-operative assessment  Comments: ATOI.  Pt not ventilated due to risk of Covid19, patient asymptomatic.

## 2019-04-19 NOTE — Progress Notes (Signed)
Assisted Dr. Hodierne with left, ultrasound guided, supraclavicular block. Side rails up, monitors on throughout procedure. See vital signs in flow sheet. Tolerated Procedure well. 

## 2019-04-19 NOTE — Transfer of Care (Signed)
Immediate Anesthesia Transfer of Care Note  Patient: Lauren Lucas  Procedure(s) Performed: Procedure(s): OPEN REDUCTION INTERNAL FIXATION (ORIF)LEFT ELBOW/OLECRANON FRACTURE, DISTAL TRICEPS TENDON REPAIR (Left)  Patient Location: PACU  Anesthesia Type:GA combined with regional for post-op pain  Level of Consciousness:  sedated, patient cooperative and responds to stimulation  Airway & Oxygen Therapy:Patient Spontanous Breathing and Patient connected to face mask oxgen  Post-op Assessment:  Report given to PACU RN and Post -op Vital signs reviewed and stable  Post vital signs:  Reviewed and stable  Last Vitals:  Vitals:   04/19/19 1020 04/19/19 1021  BP: 131/69   Pulse: 82 85  Resp: 12 15  Temp:    SpO2: 100% 100%    Complications: No apparent anesthesia complications

## 2019-04-19 NOTE — Op Note (Addendum)
Orthopaedic Surgery Operative Note (CSN: [id number])  Lauren Lucas  [birth year] Date of Surgery: 04/19/2019   Diagnoses:  left elbow olecranon fracture and distal tricep rupture  Procedure: ORIF comminuted olecranon fracture Repair distal triceps tendon   Operative Finding Patient had comminuted fracture of the olecranon with a translation on fracture dislocation and anterior subluxation of the distal component of the arm.  Bone quality was extraordinarily poor and this was a highly comminuted fracture.  There was an intercalary fragment which had only subchondral bone attached to a large area of cartilage involving the joint.  This fragment was so small that we had to attempt to use a headless screw as well as a bioabsorbable K wire for fixation as it would not hold a traditional screw.  We achieved a near anatomic reduction but with plate placement the fragment shifted.  We felt that it was in the best interest of the patient to leave the near anatomic reduction that we had in a stable joint with a good triceps repair rather than take down our repair and potentially unroofed a comminuted fragment that was unable to be fixed in any other fashion.  2 sets of sutures were placed into the triceps and tied onto the plate.  Patient had good extension and flexion to 110 degrees without significant tension however we were not comfortable flexing past that to avoid disrupting her tensioning or repair.  Images showing our provisional reduction as well as the final films are included at the end of this note.          Post-operative plan: The patient will be in a splint for 1 week with range of motion to start after that..  The patient will be discharged home with an Exparel nerve block in place.  DVT prophylaxis not indicated in amatory upper extremity patient without history of DVT.  Pain control with PRN pain medication preferring oral medicines in the setting of this patient's previous opioid  addiction for minimizing narcotics and using Toradol.  Follow up plan will be scheduled in approximately 7 days for incision check and XR.  Post-Op Diagnosis: Same Surgeons:Primary: Hiram Gash, MD Assistants: Joya Gaskins, OPAC Location: [facility] Anesthesia: Choice Antibiotics: Ancef 2g preop, Vancomycin 1054m and tobramycin 1.2 g locally  Tourniquet time: 2 hours Estimated Blood Loss: Minimal Complications: None Specimens: None Implants: Implant Name Type Inv. Item Serial No. Manufacturer Lot No. LRB No. Used Action  PLATE OLECRANON SM - [id number] PLATE OLECRANON SM  ZIMMER [employer]ON(ORTH,TRAU,BIO,SG)  Left 1 Implanted  2.5x224mmax vcp screw    BIOMET ORTHO AND TRAUMA  Left 1 Implanted  WASHER 3.66MM - [id number] Implant WASHER 3.66MM  [employer]IMMER [employer]ON(ORTH,TRAU,BIO,SG)  Left 1 Implanted  SCREW CORTICAL LOW PROF 3.5X20 - [id number] SCREW CORTICAL LOW PROF 3.5X20  ZIMMER RECON(ORTH,TRAU,BIO,SG)  Left 1 Implanted  KIT 1-PIN ORTHOSORB 2.0X40 - [id number] KIT 1-PIN ORTHOSORB 2.0X40 [id number] RECON(ORTH,TRAU,BIO,SG) [id number] 1 Implanted  SCREW LOCK CORT STAR 3.5X50 - [id number] SCREW LOCK CORT STAR 3.5X50  ZIMMER RECON(ORTH,TRAU,BIO,SG)  Left 1 Implanted  SCREW LOCK CORT STAR 3.5X14 - [id number] SCREW LOCK CORT STAR 3.5X14  ZIMMER [employer]ON(ORTH,TRAU,BIO,SG)  Left 1 Implanted  SCREW LOCK CORT STAR 3.5X20 - [id number] SCREW LOCK CORT STAR 3.5X20  ZIMMER RECON(ORTH,TRAU,BIO,SG)  Left 1 Implanted  SCREW NON LOCKING LP 3.5 16MM - [id number] SCREW NON LOCKING LP 3.5 16MM  ZIMMER [employer]ON(ORTH,TRAU,BIO,SG)  Left 1 Implanted  SCREW LOW PROFILE 3.66M2IZT24  [id number] Screw SCREW LOW PROFILE 3.0QTM22  ZIMMER RECON(ORTH,TRAU,BIO,SG)  Left 1 Implanted    Indications for Surgery:   Lauren Lucas is a 63 y.o. female with fall and delayed presentation to the clinic with a comminuted trans-olecranon fracture dislocation..  Benefits and risks of operative  and nonoperative management were discussed prior to surgery with patient/guardian(s) and informed consent form was completed.  Specific risks including infection, need for additional surgery, stiffness, failure of hardware, hardware prominence, infection, wound breakdown.   Procedure:   The patient was identified in the preoperative holding area where the surgical site was marked. The patient was taken to the OR where a procedural timeout was called and the above noted anesthesia was induced.  The patient was positioned lateral on a beanbag with arm over radiolucent sure foot..  Preoperative antibiotics were dosed.  The patient's left arm was prepped and draped in the usual sterile fashion.  A second preoperative timeout was called.      A tourniquet was used for the above listed time.   We began a longitudinal approach over the subcutaneous border of the olecranon.  The patient had an eschar over the tip of the olecranon we move their incision just medial to that.  Dissected through skin sharply achieving hemostasis we progressed.  We stayed far from the medial aspect of the olecranon as the ulnar nerve would progress in this area.  We stayed hard on bone as we performed our dissection.  We identified the fractured left and on tip which was attached to the triceps.  There is clearly significant comminution with both medial and lateral blowout of the cortical bone at the site of the fracture.  The fragment attached the olecranon had very minimal articular cartilage attached however there was an intercalary fragment that had the majority of the posterior 50% of the articular cartilage attached with only subchondral bone connected.  This made fixation extremely difficult.  We felt that we would obtain reasonable purchase with a buried headless screw.  We cleared the fracture site of interposed hematoma and comminution was unable to be fixed.  We initially achieve provisional fixation with a 0.045 K wire x2  to control rotation.  We achieved an anatomic reduction of the joint that was confirmed on fluoroscopy.  We used a 2.2 mm Biomet headless compression screw as well as a a 2 mm Orthosorb K wire to hold this fixation of the intercalary fragment to the distal fragment.  This reduction was again confirmed with direct visualization of the joint which was anatomic as well as with fluoroscopy.  We then proceeded to fix the overlying dorsal roof of the cortical fragment which was attached to the distal aspect of the triceps.  We placed 2 sutures in a Krakw fashion just at the bone tendon junction of the triceps and the olecranon.  We then use this to hold our reduction and provisionally check the reduction of fluoroscopy.  A proximal ulna plate from Biomet was used and provisionally fixed with K wires before a screw was placed to the sliding hole.  This was contoured to the appropriate shape of the olecranon.  We noted that the olecranon fragment self was extremely small and likely would not be able to be fixed well with the distal to proximal screws of the plate.  We held this reduced and provisionally tied it again having any anatomic joint on fluoroscopy.  We then carefully fixed the plate to the distal bone and then proceeded  to fill the proximal holes.  The "homerun" screw was placed without the guide in place so that we were able to shoot around our intercalary fixation.  We are able to get bicortical passage of the screw and it was placed with a nonlocking screw.  During compression there was a shift of our reduction and we lost some of her anatomic reduction however we did not feel that taking down the plate would be a good idea.  We did this we are worried that the intercalary fragment would be completely devoid of subchondral bone and at this point would be free and unable to be fixed.  We felt that the plate as well as the overlying bone which we were rafting up this chondral surface with would have to be  efficient as attempts to make the joint look more anatomic would likely lead to a worse overall outcome.  This point we placed 1 more screw into the proximal fragment as well as another screw into the plate in the distal fragment.  Both sutures were tied and we are able to achieve good reduction of the joint however the intercalary fragment was somewhat pushed dorsal at its proximal aspect.  Triceps tendon was repaired well.  We felt that she would have overall reasonable function of the elbow and that further attempts to more anatomically reduce this fragment would not be in her best interest.  Incision was irrigated with copiously before a layered closure was performed and vancomycin and tobramycin powder was placed.  A sterile dressing was placed.  A well-padded splint was placed in about 90 degrees of flexion.  The patient was awoken from general anesthesia and taken to the PACU in stable condition without complication.   Joya Gaskins, OPA-C, present and scrubbed throughout the case, critical for completion in a timely fashion, and for retraction, instrumentation, closure.

## 2019-04-19 NOTE — Anesthesia Postprocedure Evaluation (Signed)
Anesthesia Post Note  Patient: Lauren Lucas  Procedure(s) Performed: OPEN REDUCTION INTERNAL FIXATION (ORIF)LEFT ELBOW/OLECRANON FRACTURE, DISTAL TRICEPS TENDON REPAIR (Left Arm Lower)     Patient location during evaluation: PACU Anesthesia Type: General Level of consciousness: awake and alert Pain management: pain level controlled Vital Signs Assessment: post-procedure vital signs reviewed and stable Respiratory status: spontaneous breathing, nonlabored ventilation, respiratory function stable and patient connected to nasal cannula oxygen Cardiovascular status: blood pressure returned to baseline and stable Postop Assessment: no apparent nausea or vomiting Anesthetic complications: no    Last Vitals:  Vitals:   04/19/19 1430 04/19/19 1445  BP:    Pulse: 100 97  Resp: 15 (!) 22  Temp:    SpO2: 94% 93%    Last Pain:  Vitals:   04/19/19 1515  TempSrc:   PainSc: 0-No pain                 Cecile Hearing

## 2019-04-19 NOTE — Progress Notes (Signed)
Pt reports she has neighbors in adjoining apartments who are willing and able to assist her.  Pt hesitant to have anyone overnight due to COVID.  Pt reports she feels she can manage and will call for help as needed.

## 2019-04-19 NOTE — H&P (Signed)
PREOPERATIVE H&P  Chief Complaint: left elbow olecranon fracture and distal tricep rupture  HPI: Lauren Lucas is a 63 y.o. female who presents for preoperative history and physical with a diagnosis of left elbow olecranon fracture and distal tricep rupture. Symptoms are rated as moderate to severe, and have been worsening.  This is significantly impairing activities of daily living.  Please see my clinic note for full details on this patient's care.  She has elected for surgical management.   Past Medical History:  Diagnosis Date  . Anxiety   . Complication of anesthesia   . Depression   . Hepatitis    Hep C for 10 years, asymtomatic- has not received treatment  . Heroin addiction (HCC)   . Mitral valve prolapse   . PONV (postoperative nausea and vomiting)    Past Surgical History:  Procedure Laterality Date  . ABDOMINAL HYSTERECTOMY    . CHOLECYSTECTOMY    . DENTAL SURGERY    . SPLENECTOMY     Social History   Socioeconomic History  . Marital status: Divorced    Spouse name: Not on file  . Number of children: Not on file  . Years of education: Not on file  . Highest education level: Not on file  Occupational History  . Not on file  Social Needs  . Financial resource strain: Not on file  . Food insecurity:    Worry: Not on file    Inability: Not on file  . Transportation needs:    Medical: Not on file    Non-medical: Not on file  Tobacco Use  . Smoking status: Never Smoker  . Smokeless tobacco: Never Used  Substance and Sexual Activity  . Alcohol use: No    Comment: None for one and half years  . Drug use: Yes    Comment: no drugs-heroin since 2015  . Sexual activity: Yes    Birth control/protection: None  Lifestyle  . Physical activity:    Days per week: Not on file    Minutes per session: Not on file  . Stress: Not on file  Relationships  . Social connections:    Talks on phone: Not on file    Gets together: Not on file    Attends religious  service: Not on file    Active member of club or organization: Not on file    Attends meetings of clubs or organizations: Not on file    Relationship status: Not on file  Other Topics Concern  . Not on file  Social History Narrative  . Not on file   Family History  Problem Relation Age of Onset  . Hypertension Mother   . Cancer Mother   . Hypertension Father   . Heart failure Father    Allergies  Allergen Reactions  . Antihistamines, Diphenhydramine-Type Hypertension  . Sulfa Antibiotics Hives    Sulfa meds, not necessarily abx   Prior to Admission medications   Medication Sig Start Date End Date Taking? Authorizing Provider  Ca Carbonate-Mag Hydroxide (ROLAIDS PO) Take 1-2 tablets by mouth 2 (two) times daily as needed (acid reflux).   Yes [provider]  citalopram (CELEXA) 20 MG tablet Take 20 mg by mouth daily.   Yes [provider]  fluticasone (FLONASE) 50 MCG/ACT nasal spray Place 1 spray into both nostrils daily as needed for allergies or rhinitis.   Yes [provider]  ibuprofen (ADVIL,MOTRIN) 200 MG tablet Take 400 mg by mouth every 6 (six) hours as  needed for moderate pain.    Yes [provider]  neomycin-bacitracin-polymyxin (NEOSPORIN) ointment Apply 1 application topically as needed for wound care.   Yes [provider]  sodium chloride (OCEAN) 0.65 % SOLN nasal spray Place 1 spray into both nostrils as needed for congestion.   Yes [provider]  traZODone (DESYREL) 100 MG tablet Take 1 tablet (100 mg total) by mouth at bedtime as needed for sleep. For depression/sleep Patient taking differently: Take 100 mg by mouth at bedtime. For depression/sleep 01/26/13  Yes Armandina Stammer I, NP     Positive ROS: All other systems have been reviewed and were otherwise negative with the exception of those mentioned in the HPI and as above.  Physical Exam: General: Alert, no acute distress Cardiovascular: No pedal  edema Respiratory: No cyanosis, no use of accessory musculature GI: No organomegaly, abdomen is soft and non-tender Skin: No lesions in the area of chief complaint Neurologic: Sensation intact distally Psychiatric: Patient is competent for consent with normal mood and affect Lymphatic: No axillary or cervical lymphadenopathy  MUSCULOSKELETAL: L arm splint in place, nerve block in place.  wwp hand.  Assessment: left elbow olecranon fracture and distal tricep rupture  Plan: Plan for Procedure(s): OPEN REDUCTION INTERNAL FIXATION (ORIF)LEFT ELBOW/OLECRANON FRACTURE, DISTAL TRICEPS TENDON REPAIR  Patient at specific risk of infection, hardware pain and pain control issues in the future due to previous opiod addiction.  The risks benefits and alternatives were discussed with the patient including but not limited to the risks of nonoperative treatment, versus surgical intervention including infection, bleeding, nerve injury,  blood clots, cardiopulmonary complications, morbidity, mortality, among others, and they were willing to proceed.   Bjorn Pippin, MD  04/19/2019 10:54 AM

## 2019-04-19 NOTE — Anesthesia Procedure Notes (Signed)
Anesthesia Regional Block: Supraclavicular block   Pre-Anesthetic Checklist: ,, timeout performed, Correct Patient, Correct Site, Correct Laterality, Correct Procedure, Correct Position, site marked, Risks and benefits discussed,  Surgical consent,  Pre-op evaluation,  At surgeon's request and post-op pain management  Laterality: Left  Prep: chloraprep       Needles:  Injection technique: Single-shot  Needle Type: Echogenic Stimulator Needle     Needle Length: 5cm  Needle Gauge: 22     Additional Needles:   Procedures:, nerve stimulator,,,,,,,   Nerve Stimulator or Paresthesia:  Response: biceps flexion, 0.45 mA,   Additional Responses:   Narrative:  Start time: 04/19/2019 9:01 AM End time: 04/19/2019 9:13 AM Injection made incrementally with aspirations every 5 mL.  Performed by: Personally  Anesthesiologist: Achille Rich, MD  Additional Notes: Functioning IV was confirmed and monitors were applied.  A 79mm 22ga Arrow echogenic stimulator needle was used. Sterile prep and drape,hand hygiene and sterile gloves were used.  Negative aspiration and negative test dose prior to incremental administration of local anesthetic. The patient tolerated the procedure well.  Ultrasound guidance: relevent anatomy identified, needle position confirmed, local anesthetic spread visualized around nerve(s), vascular puncture avoided.  Image printed for medical record.

## 2019-04-28 ENCOUNTER — Encounter (HOSPITAL_COMMUNITY): Payer: Self-pay | Admitting: Orthopaedic Surgery

## 2019-05-05 ENCOUNTER — Ambulatory Visit: Payer: Medicaid Other | Attending: Orthopaedic Surgery | Admitting: Occupational Therapy

## 2019-05-09 ENCOUNTER — Other Ambulatory Visit: Payer: Self-pay

## 2019-05-09 ENCOUNTER — Ambulatory Visit: Payer: Medicaid Other | Attending: Orthopaedic Surgery | Admitting: Occupational Therapy

## 2019-05-09 DIAGNOSIS — M25512 Pain in left shoulder: Secondary | ICD-10-CM | POA: Insufficient documentation

## 2019-05-09 DIAGNOSIS — M25522 Pain in left elbow: Secondary | ICD-10-CM

## 2019-05-09 DIAGNOSIS — M25622 Stiffness of left elbow, not elsewhere classified: Secondary | ICD-10-CM

## 2019-05-09 DIAGNOSIS — M25632 Stiffness of left wrist, not elsewhere classified: Secondary | ICD-10-CM | POA: Insufficient documentation

## 2019-05-09 DIAGNOSIS — M6281 Muscle weakness (generalized): Secondary | ICD-10-CM | POA: Diagnosis present

## 2019-05-09 DIAGNOSIS — M25612 Stiffness of left shoulder, not elsewhere classified: Secondary | ICD-10-CM | POA: Insufficient documentation

## 2019-05-09 DIAGNOSIS — G8929 Other chronic pain: Secondary | ICD-10-CM | POA: Diagnosis present

## 2019-05-09 NOTE — Patient Instructions (Signed)
AROM: Elbow Flexion / Extension    Gently bend elbow just past 90* then straighten slightly but do not fully extend elbow. Repeat _10-15___ times per set. Do _4-6___ sessions per day.    Elbow / Wrist Supination / Pronation    AROM: Wrist Extension   .  With ____ palm down, bend wrist up. Repeat __15__ times per set.  Do __4-6__ sessions per day.    AROM: Wrist Flexion .   AROM: Forearm Pronation / Supination   With left____ arm in handshake position, slowly rotate palm down until stretch is felt. Relax. Then rotate palm up until stretch is felt. Repeat _15___ times per set. Do _4-6___ sessions per day.  Copyright  VHI. All rights reserved.     AROM: Wrist Extension   .  With __left__ palm down, bend wrist up. Repeat __15__ times per set.  Do __4-6__ sessions per day.    AROM: Wrist Flexion    AROM: Forearm Pronation / Supination   With _left___ arm in handshake position, slowly rotate palm down until stretch is felt. Relax. Then rotate palm up until stretch is felt. Repeat _15___ times per set. Do _4-6___ sessions per day.  Copyright  VHI. All rights reserved.         WEARING SCHEDULE:  Wear splint at ALL times except for hygiene care (May remove splint for exercises and showering and then immediately place back on ONLY if directed by the therapist)  PURPOSE:  To prevent movement and for protection until injury can heal  CARE OF SPLINT:  Keep splint away from heat sources including: stove, radiator or furnace, or a car in sunlight. The splint can melt and will no longer fit you properly  Keep away from pets and children  Clean the splint with rubbing alcohol 1-2 times per day.  * During this time, make sure you also clean your hand/arm as instructed by your therapist and/or perform dressing changes as needed. Then dry hand/arm completely before replacing splint. (When cleaning hand/arm, keep it immobilized in same position until splint is  replaced)  PRECAUTIONS/POTENTIAL PROBLEMS: *If you notice or experience increased pain, swelling, numbness, or a lingering reddened area from the splint: Contact your therapist immediately by calling [phone]. You must wear the splint for protection, but we will get you scheduled for adjustments as quickly as possible.  (If only straps or hooks need to be replaced and NO adjustments to the splint need to be made, just call the office ahead and let them know you are coming in)  If you have any medical concerns or signs of infection, please call your doctor immediately

## 2019-05-10 ENCOUNTER — Telehealth: Payer: Self-pay | Admitting: Occupational Therapy

## 2019-05-10 NOTE — Telephone Encounter (Signed)
Therapist called pt. to clarify precautions and HEP. Therapist spoke with Dr. Everardo Pacific via telephone. He clarified that pt. does not have any precautions for extension, however she should work on flexion to 70*, (not to exceed 90*) initially for 6 weeks. (Therapist told MD that pt had achieved 95* on one occasion yesterday, however Dr. Everardo Pacific stated that this was not concerning to him). Pt was instructed to perform elbow flexion to less than 90*(or less than L angle initally), and to stop if she felt strain. Pt was instructed to work on fully extending her elbow.  Pt verbalized understanding of updates to HEP.

## 2019-05-10 NOTE — Therapy (Signed)
[facility] [address] [place], [place], [address] Phone: [phone]   Fax:  [phone]  Occupational Therapy Evaluation  Patient Details  Name: Lauren Lucas MRN: [id number] Date of Birth: September 17, 1956 Referring Provider (OT): Dr. Everardo Pacific   Encounter Date: 05/09/2019  OT End of Session - 05/10/19 1021    Visit Number  1    Number of Visits  20    Date for OT Re-Evaluation  08/08/19    Authorization Type  Medicaid await auth    OT Start Time  1426    OT Stop Time  1600    OT Time Calculation (min)  94 min    Activity Tolerance  Patient tolerated treatment well    Behavior During Therapy  [facility] for tasks assessed/performed       Past Medical History:  Diagnosis Date  . Anxiety   . Complication of anesthesia   . Depression   . Hepatitis    Hep C for 10 years, asymtomatic- has not received treatment  . Heroin addiction (HCC)   . Mitral valve prolapse   . PONV (postoperative nausea and vomiting)     Past Surgical History:  Procedure Laterality Date  . ABDOMINAL HYSTERECTOMY    . CHOLECYSTECTOMY    . DENTAL SURGERY    . ORIF ELBOW FRACTURE Left 04/19/2019   Procedure: OPEN REDUCTION INTERNAL FIXATION (ORIF)LEFT ELBOW/OLECRANON FRACTURE, DISTAL TRICEPS TENDON REPAIR;  Surgeon: Bjorn Pippin, MD;  Location: [facility];  Service: Orthopedics;  Laterality: Left;  . SPLENECTOMY      There were no vitals filed for this visit.  Subjective Assessment - 05/10/19 1019    Subjective   Pt reports mild pain    Pertinent History  ORIF olecranon fx 04/19/19, triceps repair, Hep C, opiod dependence    Patient Stated Goals  regain use of arm    Currently in Pain?  Yes    Pain Score  4     Pain Location  Elbow   shoulder   Pain Orientation  Left    Pain Descriptors / Indicators  Aching    Pain Type  Acute pain    Pain Onset  1 to 4 weeks ago    Pain Frequency  Intermittent    Aggravating Factors   movement    Pain  Relieving Factors  rest    Multiple Pain Sites  No        [facility] OT Assessment - 05/10/19 1034      Assessment   Medical Diagnosis  ORIF L  olecranon fx, triceps repair   hx of Hep C   Referring Provider (OT)  Dr. Everardo Pacific    Onset Date/Surgical Date  04/19/19    Hand Dominance  Right      Precautions   Precautions  Other (comment)    Precaution Comments  splint at 90*, work to 70* flexion, long arm splint when not bathing or exercising      Restrictions   Weight Bearing Restrictions  --      Balance Screen   Has the patient fallen in the past 6 months  Yes    How many times?  1    Has the patient had a decrease in activity level because of a fear of falling?   No    Is the patient reluctant to leave their home because of a fear of falling?   No      Home  Environment   Family/patient  expects to be discharged to:  Private residence    Available Help at Discharge  Friend(s)    Lives With  Alone      Prior Function   Level of Independence  Independent    Vocation  On disability      ADL   ADL comments  Pt is currently performing all basic ADLs with her dominant RUE   Pt has friends who assist prn     Mobility   Mobility Status  Independent      Written Expression   Dominant Hand  Right      Cognition   Overall Cognitive Status  Within Functional Limits for tasks assessed      Coordination   Gross Motor Movements are Fluid and Coordinated  No      ROM / Strength   AROM / PROM / Strength  AROM      AROM   Overall AROM   Deficits;Due to precautions;Due to pain    Overall AROM Comments  LUE elbow flexion/ extension: 95/50, forearm supination/ pronation 70/50, wrist flexion/ extension 45/45               OT Treatments/Exercises (OP) - 05/10/19 0001      Splinting   Splinting  Pt arrived wearing protective cast splint. splint was removed and LUE was washed with soap and water and dried thoroughly. Pt's arm was dressed stickinette. Pt has steristrips over  incision and no s/s of infection. Pt was fitted with a long arm splint with elbow in approx 90* flexion, forearm neutral. Pt was instructed in splint wear, care and precautions            OT Education - 05/10/19 1041    Education Details  initial HEP for A/ROM, pt was instructed to limit elbow flexion to grossly 110* initally and not to perform full extension, splint wear, care and precautions.    Person(s) Educated  Patient    Methods  Explanation;Demonstration;Verbal cues;Handout    Comprehension  Verbalized understanding;Returned demonstration;Verbal cues required       OT Short Term Goals - 05/10/19 1045      OT SHORT TERM GOAL #1   Title  I with inital HEP    Baseline  dependent    Time  4    Period  Weeks    Status  New    Target Date  06/09/19      OT SHORT TERM GOAL #2   Title  Pt will verbalize understanding of splint wear, care and precautions following 1-2 weeks to ensure proper fit.    Baseline  dependent, splint isssued inital visit    Time  4    Period  Weeks    Status  New      OT SHORT TERM GOAL #3   Title  Pt will increase left elbow flexion to 105* in prep for functional use.    Baseline  95    Time  4    Period  Weeks    Status  New        OT Long Term Goals - 05/10/19 1047      OT LONG TERM GOAL #1   Title  I with updated HEP.    Baseline  dependent    Time  12    Period  Weeks    Status  New      OT LONG TERM GOAL #2   Title  Pt will resume use of LUE as  a non dominant assist at least 90% of the time with pain less than or equal to 3/10.    Baseline  unable to use LUE functionallly due to precautions.    Time  12    Period  Weeks    Status  New      OT LONG TERM GOAL #3   Title  Pt will demonstrate elbow flexion to at least 125* for increased functional use during ADLs.    Baseline  95*    Time  12    Period  Weeks    Status  New      OT LONG TERM GOAL #4   Title  Pt will demonstrate elbow extension to -10 for improved  functional reach.    Baseline  -50    Time  12    Period  Weeks      OT LONG TERM GOAL #5   Title  Pt will demonstrate supination/ pronation to 85* for increased ease with ADLs.    Baseline  supination/ 70/pronation 80    Time  12    Period  Weeks    Status  New            Plan - 05/10/19 1022    Clinical Impression Statement  TERRINA DOCTER is a 63 y.o. female s/p left elbow olecranon fracture and distal triceps rupture who underwent ORIF comminuted olecranon fracture and repair distal triceps tendon on 04/19/19 by Dr. Ramond Marrow. Pt presents with the following deficits: decreased strength, decreased ROM,  pain,  and decreased LUE functional use which impedes performance of ADLS/IADLS.PMH: HEP C, opoid dependence. Pt can benefit from skilled occupational therapy to maximize safety and indpendence with daily activities. Pt is on disability. she was independent prior to injury. Pt. lieves alone with intermittant assist from friends    OT Occupational Profile and History  Problem Focused Assessment - Including review of records relating to presenting problem    Occupational performance deficits (Please refer to evaluation for details):  ADL's;IADL's;Rest and Sleep;Leisure;Social Participation    Body Structure / Function / Physical Skills  ADL;Strength;Pain;Dexterity;UE functional use;ROM;IADL;Endurance;Flexibility;Coordination;FMC    Rehab Potential  Good    Clinical Decision Making  Limited treatment options, no task modification necessary    Comorbidities Affecting Occupational Performance:  May have comorbidities impacting occupational performance    Modification or Assistance to Complete Evaluation   No modification of tasks or assist necessary to complete eval    OT Frequency  --   1x week x 3 weeks followed by 2x week x 8 weeks   OT Treatment/Interventions  Self-care/ADL training;Moist Heat;Fluidtherapy;DME and/or AE instruction;Splinting;Therapeutic activities;Aquatic  Therapy;Ultrasound;Therapeutic exercise;Passive range of motion;Neuromuscular education;Cryotherapy;Electrical Stimulation;Paraffin;Energy conservation;Manual Therapy       Patient will benefit from skilled therapeutic intervention in order to improve the following deficits and impairments:  Body Structure / Function / Physical Skills  Visit Diagnosis: Pain in left elbow - Plan: Ot plan of care cert/re-cert  Stiffness of left elbow, not elsewhere classified - Plan: Ot plan of care cert/re-cert  Stiffness of left wrist, not elsewhere classified - Plan: Ot plan of care cert/re-cert  Muscle weakness (generalized) - Plan: Ot plan of care cert/re-cert    Problem List Patient Active Problem List   Diagnosis Date Noted  . Major depression 01/20/2013  . Generalized anxiety disorder 01/20/2013  . Opioid use with withdrawal (HCC) 01/20/2013  . Opioid dependence (HCC) 01/19/2013    Class: Acute    , 05/10/2019, 10:59  AM Keene Breath, OTR/L [phone] Phone: [phone] 10:59 AM 05/10/19 [facility] [address] [place], [place], [address] Phone: [phone]   Fax:  [phone]  Name: MYALYNN LINGLE MRN: [id number] Date of Birth: [birth year]

## 2019-05-17 ENCOUNTER — Telehealth: Payer: Self-pay | Admitting: Occupational Therapy

## 2019-05-17 ENCOUNTER — Ambulatory Visit: Payer: Medicaid Other | Admitting: Occupational Therapy

## 2019-05-17 NOTE — Telephone Encounter (Signed)
Therapist spoke with pt. Via telephone. Pt. Reports hoarseness/ laryngitis. Therapist recommended pt does not attend therapy today in case of illness. Therapist reviewed precautions/ exercises with pt verbally and especially the importance of avoiding elbow flexion greater than approx 70* and to stop if she feels pressure with elbow flexion. Pt reports splint is fitting well with no issues.

## 2019-05-24 ENCOUNTER — Ambulatory Visit: Payer: Medicaid Other | Admitting: Occupational Therapy

## 2019-05-31 ENCOUNTER — Ambulatory Visit: Payer: Medicaid Other | Admitting: Occupational Therapy

## 2019-05-31 ENCOUNTER — Other Ambulatory Visit: Payer: Self-pay

## 2019-05-31 DIAGNOSIS — M25522 Pain in left elbow: Secondary | ICD-10-CM

## 2019-05-31 DIAGNOSIS — M6281 Muscle weakness (generalized): Secondary | ICD-10-CM

## 2019-05-31 DIAGNOSIS — M25622 Stiffness of left elbow, not elsewhere classified: Secondary | ICD-10-CM

## 2019-05-31 DIAGNOSIS — M25632 Stiffness of left wrist, not elsewhere classified: Secondary | ICD-10-CM

## 2019-05-31 NOTE — Therapy (Signed)
[facility] [address] [place], [place], [address] Phone: [phone]   Fax:  [phone]  Occupational Therapy Treatment  Patient Details  Name: Lauren Lucas MRN: [id number] Date of Birth: [birth year] Referring Provider (OT): Dr. Everardo PacificVarkey   Encounter Date: 05/31/2019  OT End of Session - 05/31/19 1529    Visit Number  2    Number of Visits  20    Date for OT Re-Evaluation  08/08/19    Authorization Type  CCME approved 3 visits 6/10-6/30/20    Authorization - Visit Number  1    Authorization - Number of Visits  3    OT Start Time  1503    OT Stop Time  1545    OT Time Calculation (min)  42 min    Activity Tolerance  Patient tolerated treatment well    Behavior During Therapy  [facility] for tasks assessed/performed       Past Medical History:  Diagnosis Date  . Anxiety   . Complication of anesthesia   . Depression   . Hepatitis    Hep C for 10 years, asymtomatic- has not received treatment  . Heroin addiction (HCC)   . Mitral valve prolapse   . PONV (postoperative nausea and vomiting)     Past Surgical History:  Procedure Laterality Date  . ABDOMINAL HYSTERECTOMY    . CHOLECYSTECTOMY    . DENTAL SURGERY    . ORIF ELBOW FRACTURE Left 04/19/2019   Procedure: OPEN REDUCTION INTERNAL FIXATION (ORIF)LEFT ELBOW/OLECRANON FRACTURE, DISTAL TRICEPS TENDON REPAIR;  Surgeon: Bjorn PippinVarkey, Dax T, MD;  Location: [facility];  Service: Orthopedics;  Laterality: Left;  . SPLENECTOMY      There were no vitals filed for this visit.  Subjective Assessment - 05/31/19 1527    Subjective   Pt reports mild pain    Pertinent History  ORIF olecranon fx 04/19/19, triceps repair, Hep C, opiod dependence    Patient Stated Goals  regain use of arm    Currently in Pain?  Yes    Pain Score  2     Pain Location  Elbow    Pain Orientation  Left    Pain Descriptors / Indicators  Aching    Pain Type  Surgical pain    Pain Onset  1 to 4 weeks  ago    Pain Frequency  Intermittent    Aggravating Factors   movement    Pain Relieving Factors  exercise                 CLINIC OPERATION CHANGES: Outpatient Neuro Rehab is open at lower capacity following universal masking, social distancing, and patient screening.  The patient's COVID risk of complications score is 1 Pt reports seeing MD yesterday. He d/c her splint and per pt report he gave her a 10 lbs lifting restriction. Pt reports there were other instructions but she could not remember them. Hot pack applied to pt's left elbow while therapist called and left a message for MD to clarify precautions. No adverse reactions. Pt was noted to have several open areas where she scratched herself, bandaids applied. Therapist reviewed A/ROM, and gentle AA/ROM exercises with pt limiting flexion to 90 until further clarification from MD. Arm bike x 5 mins level 1 for increased A/ROM. Functional reaching to place graded clothespins for increased elbow extension. Ice pack x 8 mins end of session.           OT Education - 05/31/19 1615  Education Details  Importance of exercising 6x per day, A/ROM wrist flexion/ extension, forearm supination pronation and elbow flexion to 90* and full extension    Person(s) Educated  Patient    Methods  Explanation;Demonstration;Verbal cues;Handout    Comprehension  Verbalized understanding;Returned demonstration;Verbal cues required       OT Short Term Goals - 05/10/19 1045      OT SHORT TERM GOAL #1   Title  I with inital HEP    Baseline  dependent    Time  4    Period  Weeks    Status  New    Target Date  06/09/19      OT SHORT TERM GOAL #2   Title  Pt will verbalize understanding of splint wear, care and precautions following 1-2 weeks to ensure proper fit.    Baseline  dependent, splint isssued inital visit    Time  4    Period  Weeks    Status  New      OT SHORT TERM GOAL #3   Title  Pt will increase left elbow flexion to  105* in prep for functional use.    Baseline  95    Time  4    Period  Weeks    Status  New        OT Long Term Goals - 05/10/19 1047      OT LONG TERM GOAL #1   Title  I with updated HEP.    Baseline  dependent    Time  12    Period  Weeks    Status  New      OT LONG TERM GOAL #2   Title  Pt will resume use of LUE as a non dominant assist at least 90% of the time with pain less than or equal to 3/10.    Baseline  unable to use LUE functionallly due to precautions.    Time  12    Period  Weeks    Status  New      OT LONG TERM GOAL #3   Title  Pt will demonstrate elbow flexion to at least 125* for increased functional use during ADLs.    Baseline  95*    Time  12    Period  Weeks    Status  New      OT LONG TERM GOAL #4   Title  Pt will demonstrate elbow extension to -10 for improved functional reach.    Baseline  -50    Time  12    Period  Weeks      OT LONG TERM GOAL #5   Title  Pt will demonstrate supination/ pronation to 85* for increased ease with ADLs.    Baseline  supination/ 70/pronation 80    Time  12    Period  Weeks    Status  New            Plan - 05/31/19 1531    Clinical Impression Statement  Pt reports MD d/c her splint yesterday and gave her new clearance, however pt was unable to state new precautions other than 10 lbs lifting . Therapist has called MD office for clarification of precautions.    OT Occupational Profile and History  Problem Focused Assessment - Including review of records relating to presenting problem    Occupational performance deficits (Please refer to evaluation for details):  ADL's;IADL's;Rest and Sleep;Leisure;Social Participation    Body Structure / Function / Physical Skills  ADL;Strength;Pain;Dexterity;UE functional use;ROM;IADL;Endurance;Flexibility;Coordination;FMC    Rehab Potential  Good    Clinical Decision Making  Limited treatment options, no task modification necessary    Comorbidities Affecting Occupational  Performance:  May have comorbidities impacting occupational performance    Modification or Assistance to Complete Evaluation   No modification of tasks or assist necessary to complete eval    OT Frequency  --   1x week x 3 weeks followed by 2x week x 8 weeks   OT Treatment/Interventions  Self-care/ADL training;Moist Heat;Fluidtherapy;DME and/or AE instruction;Splinting;Therapeutic activities;Aquatic Therapy;Ultrasound;Therapeutic exercise;Passive range of motion;Neuromuscular education;Cryotherapy;Electrical Stimulation;Paraffin;Energy conservation;Manual Therapy    Plan  progress HEP per MD recommendations    Consulted and Agree with Plan of Care  Patient       Patient will benefit from skilled therapeutic intervention in order to improve the following deficits and impairments:   Body Structure / Function / Physical Skills: ADL, Strength, Pain, Dexterity, UE functional use, ROM, IADL, Endurance, Flexibility, Coordination, [facility]       Visit Diagnosis: 1. Pain in left elbow   2. Stiffness of left elbow, not elsewhere classified   3. Stiffness of left wrist, not elsewhere classified   4. Muscle weakness (generalized)       Problem List Patient Active Problem List   Diagnosis Date Noted  . Major depression 01/20/2013  . Generalized anxiety disorder 01/20/2013  . Opioid use with withdrawal ([place]) 01/20/2013  . Opioid dependence ([place]) 01/19/2013    Class: Acute    , 05/31/2019, 4:20 PM  [facility] [address] [place], [place], [address] Phone: [phone]   Fax:  [phone]  Name: Lauren Lucas MRN: [id number] Date of Birth: 1956/09/10

## 2019-06-06 ENCOUNTER — Ambulatory Visit: Payer: Medicaid Other | Admitting: Occupational Therapy

## 2019-06-06 ENCOUNTER — Other Ambulatory Visit: Payer: Self-pay

## 2019-06-06 DIAGNOSIS — M25622 Stiffness of left elbow, not elsewhere classified: Secondary | ICD-10-CM

## 2019-06-06 DIAGNOSIS — G8929 Other chronic pain: Secondary | ICD-10-CM

## 2019-06-06 DIAGNOSIS — M25632 Stiffness of left wrist, not elsewhere classified: Secondary | ICD-10-CM

## 2019-06-06 DIAGNOSIS — M6281 Muscle weakness (generalized): Secondary | ICD-10-CM

## 2019-06-06 DIAGNOSIS — M25522 Pain in left elbow: Secondary | ICD-10-CM

## 2019-06-06 DIAGNOSIS — M25612 Stiffness of left shoulder, not elsewhere classified: Secondary | ICD-10-CM

## 2019-06-06 NOTE — Patient Instructions (Signed)
Elbow: Flexion   Use other hand to bend elbow with thumb toward the same shoulder. Do NOT force this motion. Hold _10___ seconds. Repeat _5-10___ times. Do _4-6___ sessions per day. CAUTION: Movement should be gentle, steady and slow.  Elbow: Extension   Place thick telephone book on table and rest upper arm on it. Grasp forearm with other hand and use a steady downward and outward pull to straighten elbow. Hold __5__ seconds. Repeat __10__ times. Do __4-6ELBOW: Flexion - Supine    Rest arm at side. Bend elbow. _10__ reps per set, __4-6_ sets per day, __5_ days per week  Copyright  [employer]HI. All rights reserved.  __ sessions per day. CAUTION: Stretch slowly and gently. Do not force joint.  Supination (Passive)   Keep elbow bent at right angle and held firmly at side. Use other hand to turn forearm until palm faces upward. Hold __10__ seconds. Repeat _5-10___ times. Do _4-6___ sessions per day.   Pronation (Passive)   Keep elbow bent at right angle and held firmly to side. Use other hand to turn forearm until palm faces downward. Hold __10__ seconds. Repeat __5-10__ times. Do __4-6__ sessions per day.

## 2019-06-06 NOTE — Therapy (Signed)
[facility] [address] [place], [place], [address] Phone: [phone]   Fax:  [phone]  Occupational Therapy Treatment  Patient Details  Name: Lauren Lucas MRN: [id number] Date of Birth: [birth year] Referring Provider (OT): Dr. Everardo PacificVarkey   Encounter Date: 06/06/2019  OT End of Session - 06/06/19 1430    Visit Number  3    Number of Visits  20    Date for OT Re-Evaluation  08/08/19    Authorization Type  CCME approved 3 visits 6/10-6/30/20    Authorization - Visit Number  2    Authorization - Number of Visits  3    OT Start Time  1406    OT Stop Time  1446    OT Time Calculation (min)  40 min    Activity Tolerance  Patient tolerated treatment well    Behavior During Therapy  [facility] for tasks assessed/performed       Past Medical History:  Diagnosis Date  . Anxiety   . Complication of anesthesia   . Depression   . Hepatitis    Hep C for 10 years, asymtomatic- has not received treatment  . Heroin addiction (HCC)   . Mitral valve prolapse   . PONV (postoperative nausea and vomiting)     Past Surgical History:  Procedure Laterality Date  . ABDOMINAL HYSTERECTOMY    . CHOLECYSTECTOMY    . DENTAL SURGERY    . ORIF ELBOW FRACTURE Left 04/19/2019   Procedure: OPEN REDUCTION INTERNAL FIXATION (ORIF)LEFT ELBOW/OLECRANON FRACTURE, DISTAL TRICEPS TENDON REPAIR;  Surgeon: Bjorn PippinVarkey, Dax T, MD;  Location: [facility];  Service: Orthopedics;  Laterality: Left;  . SPLENECTOMY      There were no vitals filed for this visit.  Subjective Assessment - 06/06/19 1511    Subjective   Pt denies pain on arrival    Pertinent History  ORIF olecranon fx 04/19/19, triceps repair, Hep C, opiod dependence    Limitations  mesaage received from Dr. Austin MilesVarkey's office on 05/23/19 clearing pt for full a/ROM and weightbearing    Patient Stated Goals  regain use of arm    Currently in Pain?  No/denies    Pain Onset  1 to 4 weeks ago                   CLINIC OPERATION CHANGES: Outpatient Neuro Rehab is open at lower capacity following universal masking, social distancing, and patient screening.  The patient's COVID risk of complications score is 1 Supine on mat A/ROM elbow flexion/ extension followed by P/ROM elbow flexion extension, P/ROM shoulder flexion and elbow extension, min v.c Seated AA/ROM elbow flexion/ extension and A/ROm supination pronation. Arm bike x 6 mins level 1 for reciprocal movment Therapist checked progress towards short term goals. AA/ROM shoulder flexion, elbow flexion/ extension and chest press, closed chain with cane, min v.c Pt demonstrates improved ROM end of session.        OT Education - 06/06/19 1516    Education Details  Pt was educated in P/ROM HEP    Person(s) Educated  Patient    Methods  Explanation;Demonstration;Verbal cues;Handout    Comprehension  Verbalized understanding;Returned demonstration;Verbal cues required       OT Short Term Goals - 06/06/19 1437      OT SHORT TERM GOAL #1   Title  I with inital HEP    Time  4    Period  Weeks    Status  Achieved    Target  Date  06/09/19      OT SHORT TERM GOAL #2   Title  Pt will verbalize understanding of splint wear, care and precautions following 1-2 weeks to ensure proper fit.    Time  4    Period  Weeks    Status  Achieved      OT SHORT TERM GOAL #3   Title  Pt will increase left elbow flexion to 105* in prep for functional use.    Baseline  105    Time  4    Period  Weeks    Status  Achieved        OT Long Term Goals - 06/06/19 1431      OT LONG TERM GOAL #1   Title  I with updated HEP.    Baseline  dependent    Time  12    Period  Weeks    Status  On-going      OT LONG TERM GOAL #2   Title  Pt will resume use of LUE as a non dominant assist at least 90% of the time with pain less than or equal to 3/10.    Baseline  uses  LUE 75% of the time for light activities only    Time  12     Period  Weeks    Status  On-going      OT LONG TERM GOAL #3   Title  Pt will demonstrate elbow flexion to at least 125* for increased functional use during ADLs.    Baseline  110* flexion    Time  12    Period  Weeks    Status  On-going      OT LONG TERM GOAL #4   Title  Pt will demonstrate elbow extension to -10 for improved functional reach.    Baseline  -35 extension    Time  12    Period  Weeks    Status  On-going      OT LONG TERM GOAL #5   Title  Pt will demonstrate supination/ pronation to 85* for increased ease with ADLs.    Baseline  90/90    Time  12    Period  Weeks    Status  Achieved            Plan - 06/06/19 1512    Clinical Impression Statement  Pt is progressing towards goals. She has achieved all Short term goals yet can benefit from continued skilled occupational therapy to increased LUE A/ROM, strength, and functional use in order to maximize pt's safety and indpendence with ADLs/IADLs.    OT Occupational Profile and History  Problem Focused Assessment - Including review of records relating to presenting problem    Occupational performance deficits (Please refer to evaluation for details):  ADL's;IADL's;Rest and Sleep;Leisure;Social Participation    Body Structure / Function / Physical Skills  ADL;Strength;Pain;Dexterity;UE functional use;ROM;IADL;Endurance;Flexibility;Coordination;FMC    Rehab Potential  Good    Clinical Decision Making  Limited treatment options, no task modification necessary    Comorbidities Affecting Occupational Performance:  May have comorbidities impacting occupational performance    Modification or Assistance to Complete Evaluation   No modification of tasks or assist necessary to complete eval    OT Frequency  1x / week    OT Duration  8 weeks    OT Treatment/Interventions  Self-care/ADL training;Moist Heat;Fluidtherapy;DME and/or AE instruction;Splinting;Therapeutic activities;Aquatic Therapy;Ultrasound;Therapeutic  exercise;Passive range of motion;Neuromuscular education;Cryotherapy;Electrical Stimulation;Paraffin;Energy conservation;Manual Therapy    Plan  A/ROM, P/ROM    Consulted and Agree with Plan of Care  Patient       Patient will benefit from skilled therapeutic intervention in order to improve the following deficits and impairments:   Body Structure / Function / Physical Skills: ADL, Strength, Pain, Dexterity, UE functional use, ROM, IADL, Endurance, Flexibility, Coordination, [facility]       Visit Diagnosis: 1. Pain in left elbow   2. Stiffness of left elbow, not elsewhere classified   3. Stiffness of left wrist, not elsewhere classified   4. Muscle weakness (generalized)   5. Chronic left shoulder pain   6. Stiffness of left shoulder, not elsewhere classified       Problem List Patient Active Problem List   Diagnosis Date Noted  . Major depression 01/20/2013  . Generalized anxiety disorder 01/20/2013  . Opioid use with withdrawal ([place]) 01/20/2013  . Opioid dependence ([place]) 01/19/2013    Class: Acute    , 06/06/2019, 3:21 PM Theone Murdoch, OTR/L [phone] Phone: [phone] 3:21 PM 06/06/19 [place] [address] [place], [place], [address] Phone: [phone]   Fax:  [phone]  Name: Lauren Lucas MRN: [id number] Date of Birth: [birth year]

## 2019-06-15 ENCOUNTER — Ambulatory Visit: Payer: Medicaid Other | Attending: Orthopaedic Surgery | Admitting: Occupational Therapy

## 2019-06-21 ENCOUNTER — Ambulatory Visit: Payer: Medicaid Other | Admitting: Occupational Therapy

## 2019-06-30 ENCOUNTER — Ambulatory Visit: Payer: Medicaid Other | Admitting: Occupational Therapy

## 2019-07-05 ENCOUNTER — Ambulatory Visit: Payer: Medicaid Other | Admitting: Occupational Therapy

## 2019-07-11 ENCOUNTER — Ambulatory Visit: Payer: Medicaid Other | Admitting: Occupational Therapy

## 2019-07-18 ENCOUNTER — Encounter: Payer: Medicaid Other | Admitting: Occupational Therapy

## 2019-10-17 DIAGNOSIS — Z9081 Acquired absence of spleen: Secondary | ICD-10-CM | POA: Insufficient documentation

## 2019-10-17 DIAGNOSIS — N939 Abnormal uterine and vaginal bleeding, unspecified: Secondary | ICD-10-CM | POA: Insufficient documentation

## 2020-02-19 DIAGNOSIS — B1920 Unspecified viral hepatitis C without hepatic coma: Secondary | ICD-10-CM | POA: Insufficient documentation

## 2020-03-14 ENCOUNTER — Encounter: Payer: Medicaid Other | Admitting: Obstetrics and Gynecology

## 2020-03-18 ENCOUNTER — Encounter: Payer: Self-pay | Admitting: *Deleted

## 2020-04-29 ENCOUNTER — Encounter: Payer: Medicaid Other | Admitting: Family Medicine

## 2020-05-13 ENCOUNTER — Other Ambulatory Visit: Payer: Self-pay | Admitting: Nurse Practitioner

## 2020-05-13 DIAGNOSIS — B182 Chronic viral hepatitis C: Secondary | ICD-10-CM

## 2020-05-24 ENCOUNTER — Ambulatory Visit
Admission: RE | Admit: 2020-05-24 | Discharge: 2020-05-24 | Disposition: A | Discharge status: Home / Self Care | Payer: Medicaid Other | Source: Ambulatory Visit | Attending: Nurse Practitioner | Admitting: Nurse Practitioner

## 2020-05-24 DIAGNOSIS — B182 Chronic viral hepatitis C: Secondary | ICD-10-CM

## 2020-06-18 ENCOUNTER — Encounter: Payer: Self-pay | Admitting: Family Medicine

## 2020-06-18 ENCOUNTER — Ambulatory Visit (INDEPENDENT_AMBULATORY_CARE_PROVIDER_SITE_OTHER): Payer: Medicaid Other | Admitting: Family Medicine

## 2020-06-18 ENCOUNTER — Other Ambulatory Visit: Payer: Self-pay

## 2020-06-18 VITALS — BP 133/81 | HR 72 | Wt 103.6 lb

## 2020-06-18 DIAGNOSIS — N362 Urethral caruncle: Secondary | ICD-10-CM

## 2020-06-18 DIAGNOSIS — N939 Abnormal uterine and vaginal bleeding, unspecified: Secondary | ICD-10-CM

## 2020-06-18 NOTE — Progress Notes (Signed)
   Subjective:    Patient ID: Lauren Lucas is a 64 y.o. female presenting with Vaginal Bleeding  on 06/18/2020  HPI: New pt. Referred for vaginal bleeding. Has h/o partial (vaginal) hysterectomy. Hysterectomy done by Dr. Henderson Cloud. She is unclear why it was done. 04/2019 noted spotting. Happens daily. Has lymph node in groin.  Review of Systems  Constitutional: Negative for chills and fever.  Respiratory: Negative for shortness of breath.   Cardiovascular: Negative for chest pain.  Gastrointestinal: Negative for abdominal pain, nausea and vomiting.  Genitourinary: Negative for dysuria.  Skin: Negative for rash.      Objective:    BP 133/81   Pulse 72   Wt 103 lb 9.6 oz (47 kg)   BMI 18.95 kg/m  Physical Exam Constitutional:      General: She is not in acute distress.    Appearance: She is well-developed.  HENT:     Head: Normocephalic and atraumatic.  Eyes:     General: No scleral icterus. Cardiovascular:     Rate and Rhythm: Normal rate.  Pulmonary:     Effort: Pulmonary effort is normal.  Abdominal:     Palpations: Abdomen is soft.  Genitourinary:    Comments: Urethral polyp noted see photo, vagina is atrophic and cuff is intact with no visible lesions. No adnexal mass or tenderness.  Musculoskeletal:     Cervical back: Neck supple.  Lymphadenopathy:     Lower Body: Right inguinal adenopathy present.  Skin:    General: Skin is warm and dry.  Neurological:     Mental Status: She is alert and oriented to person, place, and time.           Assessment & Plan:   Problem List Items Addressed This Visit      Unprioritized   Vaginal spotting - Primary    Likely urethral polyp-->to Urology      Urethral polyp    Suspect bleeding is related to this--looks too large to just be atrophy. Will send to Urology      Relevant Orders   Ambulatory referral to Urology      Total time in review of prior notes, pathology, labs, history taking, review with  patient, exam, note writing, discussion of options, plan for next steps, alternatives and risks of treatment: 32 minutes.  Return if symptoms worsen or fail to improve.  Reva Bores 06/18/2020 3:32 PM

## 2020-06-18 NOTE — Progress Notes (Signed)
Pt reports vaginal spotting since May 2020. Also reports "lump" to right lower quadrant for years, recently started "growing." States her PCP told her she may have a prolapsed bladder. Reports recent P[facility] smear 10/2019 at [facility].  Fleet Contras RN 06/18/20

## 2020-06-18 NOTE — Assessment & Plan Note (Signed)
Suspect bleeding is related to this--looks too large to just be atrophy. Will send to Urology

## 2020-06-18 NOTE — Assessment & Plan Note (Signed)
Likely urethral polyp-->to Urology

## 2020-06-19 ENCOUNTER — Telehealth (INDEPENDENT_AMBULATORY_CARE_PROVIDER_SITE_OTHER): Payer: Medicaid Other

## 2020-06-19 DIAGNOSIS — N362 Urethral caruncle: Secondary | ICD-10-CM

## 2020-06-19 NOTE — Telephone Encounter (Signed)
Contacted [facility] to begin referral process. Appt scheduled for 07/26/20 at 11 AM. [facility] will send new pt packet with appt date, time, and location. Called pt to give new appt. Front office notified to send appropriate records.

## 2021-07-10 DIAGNOSIS — B182 Chronic viral hepatitis C: Secondary | ICD-10-CM | POA: Diagnosis not present

## 2021-07-10 DIAGNOSIS — Z9189 Other specified personal risk factors, not elsewhere classified: Secondary | ICD-10-CM | POA: Diagnosis not present

## 2021-07-10 DIAGNOSIS — Z5320 Procedure and treatment not carried out because of patient's decision for unspecified reasons: Secondary | ICD-10-CM | POA: Diagnosis not present

## 2021-07-10 DIAGNOSIS — R002 Palpitations: Secondary | ICD-10-CM | POA: Diagnosis not present

## 2021-07-10 DIAGNOSIS — Z8619 Personal history of other infectious and parasitic diseases: Secondary | ICD-10-CM | POA: Diagnosis not present

## 2021-07-10 DIAGNOSIS — F1111 Opioid abuse, in remission: Secondary | ICD-10-CM | POA: Diagnosis not present

## 2021-07-10 DIAGNOSIS — I341 Nonrheumatic mitral (valve) prolapse: Secondary | ICD-10-CM | POA: Diagnosis not present

## 2021-07-10 DIAGNOSIS — N362 Urethral caruncle: Secondary | ICD-10-CM | POA: Diagnosis not present

## 2021-07-10 DIAGNOSIS — Z9081 Acquired absence of spleen: Secondary | ICD-10-CM | POA: Diagnosis not present

## 2021-07-11 DIAGNOSIS — Z9081 Acquired absence of spleen: Secondary | ICD-10-CM | POA: Diagnosis not present

## 2021-07-11 DIAGNOSIS — Z5320 Procedure and treatment not carried out because of patient's decision for unspecified reasons: Secondary | ICD-10-CM | POA: Diagnosis not present

## 2021-07-11 DIAGNOSIS — B182 Chronic viral hepatitis C: Secondary | ICD-10-CM | POA: Diagnosis not present

## 2021-07-11 DIAGNOSIS — R002 Palpitations: Secondary | ICD-10-CM | POA: Diagnosis not present

## 2021-07-11 DIAGNOSIS — I341 Nonrheumatic mitral (valve) prolapse: Secondary | ICD-10-CM | POA: Diagnosis not present

## 2021-09-19 DIAGNOSIS — F331 Major depressive disorder, recurrent, moderate: Secondary | ICD-10-CM | POA: Diagnosis not present

## 2021-11-06 DIAGNOSIS — Z419 Encounter for procedure for purposes other than remedying health state, unspecified: Secondary | ICD-10-CM | POA: Diagnosis not present

## 2021-12-07 DIAGNOSIS — Z419 Encounter for procedure for purposes other than remedying health state, unspecified: Secondary | ICD-10-CM | POA: Diagnosis not present

## 2021-12-18 DIAGNOSIS — F331 Major depressive disorder, recurrent, moderate: Secondary | ICD-10-CM | POA: Diagnosis not present

## 2022-01-07 DIAGNOSIS — Z419 Encounter for procedure for purposes other than remedying health state, unspecified: Secondary | ICD-10-CM | POA: Diagnosis not present

## 2022-02-04 DIAGNOSIS — Z419 Encounter for procedure for purposes other than remedying health state, unspecified: Secondary | ICD-10-CM | POA: Diagnosis not present

## 2022-03-07 DIAGNOSIS — Z419 Encounter for procedure for purposes other than remedying health state, unspecified: Secondary | ICD-10-CM | POA: Diagnosis not present

## 2022-03-10 DIAGNOSIS — F331 Major depressive disorder, recurrent, moderate: Secondary | ICD-10-CM | POA: Diagnosis not present

## 2022-03-19 DIAGNOSIS — B182 Chronic viral hepatitis C: Secondary | ICD-10-CM | POA: Diagnosis not present

## 2022-03-19 DIAGNOSIS — I341 Nonrheumatic mitral (valve) prolapse: Secondary | ICD-10-CM | POA: Diagnosis not present

## 2022-03-19 DIAGNOSIS — Z1231 Encounter for screening mammogram for malignant neoplasm of breast: Secondary | ICD-10-CM | POA: Diagnosis not present

## 2022-03-19 DIAGNOSIS — Z0000 Encounter for general adult medical examination without abnormal findings: Secondary | ICD-10-CM | POA: Diagnosis not present

## 2022-03-19 DIAGNOSIS — Z5982 Transportation insecurity: Secondary | ICD-10-CM | POA: Diagnosis not present

## 2022-03-27 DIAGNOSIS — I341 Nonrheumatic mitral (valve) prolapse: Secondary | ICD-10-CM | POA: Diagnosis not present

## 2022-03-27 DIAGNOSIS — R002 Palpitations: Secondary | ICD-10-CM | POA: Diagnosis not present

## 2022-03-27 DIAGNOSIS — F32A Depression, unspecified: Secondary | ICD-10-CM | POA: Diagnosis not present

## 2022-03-27 DIAGNOSIS — E785 Hyperlipidemia, unspecified: Secondary | ICD-10-CM | POA: Diagnosis not present

## 2022-04-06 DIAGNOSIS — Z419 Encounter for procedure for purposes other than remedying health state, unspecified: Secondary | ICD-10-CM | POA: Diagnosis not present

## 2022-04-08 DIAGNOSIS — R002 Palpitations: Secondary | ICD-10-CM | POA: Diagnosis not present

## 2022-04-10 DIAGNOSIS — K7401 Hepatic fibrosis, early fibrosis: Secondary | ICD-10-CM | POA: Diagnosis not present

## 2022-04-10 DIAGNOSIS — B182 Chronic viral hepatitis C: Secondary | ICD-10-CM | POA: Diagnosis not present

## 2022-04-11 DIAGNOSIS — R030 Elevated blood-pressure reading, without diagnosis of hypertension: Secondary | ICD-10-CM | POA: Diagnosis not present

## 2022-04-11 DIAGNOSIS — Z882 Allergy status to sulfonamides status: Secondary | ICD-10-CM | POA: Diagnosis not present

## 2022-04-11 DIAGNOSIS — Z79899 Other long term (current) drug therapy: Secondary | ICD-10-CM | POA: Diagnosis not present

## 2022-04-11 DIAGNOSIS — Z888 Allergy status to other drugs, medicaments and biological substances status: Secondary | ICD-10-CM | POA: Diagnosis not present

## 2022-04-11 DIAGNOSIS — S82851A Displaced trimalleolar fracture of right lower leg, initial encounter for closed fracture: Secondary | ICD-10-CM | POA: Diagnosis not present

## 2022-04-11 DIAGNOSIS — S82301A Unspecified fracture of lower end of right tibia, initial encounter for closed fracture: Secondary | ICD-10-CM | POA: Diagnosis not present

## 2022-04-11 DIAGNOSIS — S82831A Other fracture of upper and lower end of right fibula, initial encounter for closed fracture: Secondary | ICD-10-CM | POA: Diagnosis not present

## 2022-04-21 DIAGNOSIS — R002 Palpitations: Secondary | ICD-10-CM | POA: Diagnosis not present

## 2022-04-22 DIAGNOSIS — Z882 Allergy status to sulfonamides status: Secondary | ICD-10-CM | POA: Diagnosis not present

## 2022-04-22 DIAGNOSIS — G8918 Other acute postprocedural pain: Secondary | ICD-10-CM | POA: Diagnosis not present

## 2022-04-22 DIAGNOSIS — Z888 Allergy status to other drugs, medicaments and biological substances status: Secondary | ICD-10-CM | POA: Diagnosis not present

## 2022-04-22 DIAGNOSIS — F329 Major depressive disorder, single episode, unspecified: Secondary | ICD-10-CM | POA: Diagnosis not present

## 2022-04-22 DIAGNOSIS — S82851A Displaced trimalleolar fracture of right lower leg, initial encounter for closed fracture: Secondary | ICD-10-CM | POA: Diagnosis not present

## 2022-04-22 DIAGNOSIS — Z79899 Other long term (current) drug therapy: Secondary | ICD-10-CM | POA: Diagnosis not present

## 2022-04-22 DIAGNOSIS — F419 Anxiety disorder, unspecified: Secondary | ICD-10-CM | POA: Diagnosis not present

## 2022-04-22 DIAGNOSIS — B1920 Unspecified viral hepatitis C without hepatic coma: Secondary | ICD-10-CM | POA: Diagnosis not present

## 2022-04-22 DIAGNOSIS — I341 Nonrheumatic mitral (valve) prolapse: Secondary | ICD-10-CM | POA: Diagnosis not present

## 2022-04-22 DIAGNOSIS — S82851D Displaced trimalleolar fracture of right lower leg, subsequent encounter for closed fracture with routine healing: Secondary | ICD-10-CM | POA: Diagnosis not present

## 2022-04-29 DIAGNOSIS — M25571 Pain in right ankle and joints of right foot: Secondary | ICD-10-CM | POA: Diagnosis not present

## 2022-05-07 DIAGNOSIS — Z419 Encounter for procedure for purposes other than remedying health state, unspecified: Secondary | ICD-10-CM | POA: Diagnosis not present

## 2022-06-06 DIAGNOSIS — Z419 Encounter for procedure for purposes other than remedying health state, unspecified: Secondary | ICD-10-CM | POA: Diagnosis not present

## 2022-06-10 DIAGNOSIS — F331 Major depressive disorder, recurrent, moderate: Secondary | ICD-10-CM | POA: Diagnosis not present

## 2022-06-10 DIAGNOSIS — S82851D Displaced trimalleolar fracture of right lower leg, subsequent encounter for closed fracture with routine healing: Secondary | ICD-10-CM | POA: Diagnosis not present

## 2022-06-25 DIAGNOSIS — M25571 Pain in right ankle and joints of right foot: Secondary | ICD-10-CM | POA: Diagnosis not present

## 2022-07-02 DIAGNOSIS — M25571 Pain in right ankle and joints of right foot: Secondary | ICD-10-CM | POA: Diagnosis not present

## 2022-07-07 DIAGNOSIS — Z419 Encounter for procedure for purposes other than remedying health state, unspecified: Secondary | ICD-10-CM | POA: Diagnosis not present

## 2022-07-14 DIAGNOSIS — M25571 Pain in right ankle and joints of right foot: Secondary | ICD-10-CM | POA: Diagnosis not present

## 2022-07-16 DIAGNOSIS — M25571 Pain in right ankle and joints of right foot: Secondary | ICD-10-CM | POA: Diagnosis not present

## 2022-07-21 DIAGNOSIS — M25571 Pain in right ankle and joints of right foot: Secondary | ICD-10-CM | POA: Diagnosis not present

## 2022-07-30 ENCOUNTER — Other Ambulatory Visit: Payer: Self-pay

## 2022-07-30 ENCOUNTER — Emergency Department (HOSPITAL_COMMUNITY)
Admission: EM | Admit: 2022-07-30 | Discharge: 2022-07-30 | Disposition: A | Discharge status: Home / Self Care | Payer: Medicaid Other | Attending: Emergency Medicine | Admitting: Emergency Medicine

## 2022-07-30 ENCOUNTER — Encounter (HOSPITAL_COMMUNITY): Payer: Self-pay | Admitting: Emergency Medicine

## 2022-07-30 DIAGNOSIS — K56609 Unspecified intestinal obstruction, unspecified as to partial versus complete obstruction: Secondary | ICD-10-CM | POA: Diagnosis not present

## 2022-07-30 DIAGNOSIS — R112 Nausea with vomiting, unspecified: Secondary | ICD-10-CM | POA: Insufficient documentation

## 2022-07-30 DIAGNOSIS — R0689 Other abnormalities of breathing: Secondary | ICD-10-CM | POA: Diagnosis not present

## 2022-07-30 DIAGNOSIS — R109 Unspecified abdominal pain: Secondary | ICD-10-CM | POA: Diagnosis not present

## 2022-07-30 DIAGNOSIS — K565 Intestinal adhesions [bands], unspecified as to partial versus complete obstruction: Secondary | ICD-10-CM | POA: Diagnosis not present

## 2022-07-30 DIAGNOSIS — F1190 Opioid use, unspecified, uncomplicated: Secondary | ICD-10-CM | POA: Diagnosis not present

## 2022-07-30 DIAGNOSIS — I341 Nonrheumatic mitral (valve) prolapse: Secondary | ICD-10-CM | POA: Diagnosis not present

## 2022-07-30 DIAGNOSIS — Z743 Need for continuous supervision: Secondary | ICD-10-CM | POA: Diagnosis not present

## 2022-07-30 DIAGNOSIS — Z79899 Other long term (current) drug therapy: Secondary | ICD-10-CM | POA: Diagnosis not present

## 2022-07-30 DIAGNOSIS — R42 Dizziness and giddiness: Secondary | ICD-10-CM | POA: Diagnosis not present

## 2022-07-30 DIAGNOSIS — K5989 Other specified functional intestinal disorders: Secondary | ICD-10-CM | POA: Diagnosis not present

## 2022-07-30 DIAGNOSIS — Z90710 Acquired absence of both cervix and uterus: Secondary | ICD-10-CM | POA: Diagnosis not present

## 2022-07-30 DIAGNOSIS — Z882 Allergy status to sulfonamides status: Secondary | ICD-10-CM | POA: Diagnosis not present

## 2022-07-30 DIAGNOSIS — D739 Disease of spleen, unspecified: Secondary | ICD-10-CM | POA: Diagnosis not present

## 2022-07-30 DIAGNOSIS — Z5986 Financial insecurity: Secondary | ICD-10-CM | POA: Diagnosis not present

## 2022-07-30 DIAGNOSIS — F32A Depression, unspecified: Secondary | ICD-10-CM | POA: Diagnosis not present

## 2022-07-30 DIAGNOSIS — Z5321 Procedure and treatment not carried out due to patient leaving prior to being seen by health care provider: Secondary | ICD-10-CM | POA: Insufficient documentation

## 2022-07-30 DIAGNOSIS — Z9049 Acquired absence of other specified parts of digestive tract: Secondary | ICD-10-CM | POA: Diagnosis not present

## 2022-07-30 DIAGNOSIS — K5790 Diverticulosis of intestine, part unspecified, without perforation or abscess without bleeding: Secondary | ICD-10-CM | POA: Diagnosis not present

## 2022-07-30 DIAGNOSIS — F419 Anxiety disorder, unspecified: Secondary | ICD-10-CM | POA: Diagnosis not present

## 2022-07-30 DIAGNOSIS — Z888 Allergy status to other drugs, medicaments and biological substances status: Secondary | ICD-10-CM | POA: Diagnosis not present

## 2022-07-30 LAB — CBC WITH DIFFERENTIAL/PLATELET
Abs Immature Granulocytes: 0.05 10*3/uL (ref 0.00–0.07)
Basophils Absolute: 0 10*3/uL (ref 0.0–0.1)
Basophils Relative: 0 %
Eosinophils Absolute: 0 10*3/uL (ref 0.0–0.5)
Eosinophils Relative: 0 %
HCT: 44.5 % (ref 36.0–46.0)
Hemoglobin: 14.9 g/dL (ref 12.0–15.0)
Immature Granulocytes: 0 %
Lymphocytes Relative: 19 %
Lymphs Abs: 2.7 10*3/uL (ref 0.7–4.0)
MCH: 28.2 pg (ref 26.0–34.0)
MCHC: 33.5 g/dL (ref 30.0–36.0)
MCV: 84.1 fL (ref 80.0–100.0)
Monocytes Absolute: 2.1 10*3/uL — ABNORMAL HIGH (ref 0.1–1.0)
Monocytes Relative: 14 %
Neutro Abs: 9.5 10*3/uL — ABNORMAL HIGH (ref 1.7–7.7)
Neutrophils Relative %: 67 %
Platelets: 471 10*3/uL — ABNORMAL HIGH (ref 150–400)
RBC: 5.29 MIL/uL — ABNORMAL HIGH (ref 3.87–5.11)
RDW: 13.6 % (ref 11.5–15.5)
WBC: 14.4 10*3/uL — ABNORMAL HIGH (ref 4.0–10.5)
nRBC: 0 % (ref 0.0–0.2)

## 2022-07-30 LAB — URINALYSIS, ROUTINE W REFLEX MICROSCOPIC
Bacteria, UA: NONE SEEN
Bilirubin Urine: NEGATIVE
Glucose, UA: NEGATIVE mg/dL
Hgb urine dipstick: NEGATIVE
Ketones, ur: 80 mg/dL — AB
Nitrite: NEGATIVE
Protein, ur: 30 mg/dL — AB
Specific Gravity, Urine: 1.028 (ref 1.005–1.030)
pH: 5 (ref 5.0–8.0)

## 2022-07-30 LAB — COMPREHENSIVE METABOLIC PANEL
ALT: 22 U/L (ref 0–44)
AST: 32 U/L (ref 15–41)
Albumin: 3.7 g/dL (ref 3.5–5.0)
Alkaline Phosphatase: 114 U/L (ref 38–126)
Anion gap: 18 — ABNORMAL HIGH (ref 5–15)
BUN: 17 mg/dL (ref 8–23)
CO2: 23 mmol/L (ref 22–32)
Calcium: 9.6 mg/dL (ref 8.9–10.3)
Chloride: 92 mmol/L — ABNORMAL LOW (ref 98–111)
Creatinine, Ser: 0.93 mg/dL (ref 0.44–1.00)
GFR, Estimated: >60 mL/min (ref >60)
Glucose, Bld: 152 mg/dL — ABNORMAL HIGH (ref 70–99)
Potassium: 3.7 mmol/L (ref 3.5–5.1)
Sodium: 133 mmol/L — ABNORMAL LOW (ref 135–145)
Total Bilirubin: 1.2 mg/dL (ref 0.3–1.2)
Total Protein: 8.7 g/dL — ABNORMAL HIGH (ref 6.5–8.1)

## 2022-07-30 LAB — LIPASE, BLOOD: Lipase: 32 U/L (ref 11–51)

## 2022-07-30 MED ORDER — ONDANSETRON 4 MG PO TBDP
4.0000 mg | ORAL_TABLET | Freq: Once | ORAL | Status: AC
  Administered 2022-07-30: 4 mg via ORAL
  Filled 2022-07-30: qty 1

## 2022-07-30 NOTE — ED Triage Notes (Signed)
Pt brought to ED by [facility] with c/o nausea and vomiting after heroine use today. Has been using for last few days and has not eaten.   EMS Vitals  BP 114/64 HR 84 SPO2 96% ROOM AIR CBG 123

## 2022-07-30 NOTE — ED Notes (Addendum)
Pt family member said pt will be leaving

## 2022-07-30 NOTE — ED Provider Triage Note (Signed)
Emergency Medicine Provider Triage Evaluation Note  Lauren Lucas , a 66 y.o. female  was evaluated in triage.  Pt complains of n/v. Report having nausea and vomiting x 3 days.  Having some loose stool.  Endorse abd pain.  No fever, chills, dysuria.  Hx of heroine use, last use today.  Review of Systems  Positive: As above Negative: As above  Physical Exam  BP 122/87   Pulse 76   Temp (!) 97.5 F (36.4 C) (Oral)   Resp (!) 24   SpO2 98%  Gen:   Awake, no distress   Resp:  Normal effort  MSK:   Moves extremities without difficulty  Other:    Medical Decision Making  Medically screening exam initiated at 8:33 PM.  Appropriate orders placed.  Lauren Lucas was informed that the remainder of the evaluation will be completed by another provider, this initial triage assessment does not replace that evaluation, and the importance of remaining in the ED until their evaluation is complete.     Lauren Helper, PA-C 07/30/22 2036

## 2022-07-31 DIAGNOSIS — K5989 Other specified functional intestinal disorders: Secondary | ICD-10-CM | POA: Diagnosis not present

## 2022-07-31 DIAGNOSIS — D739 Disease of spleen, unspecified: Secondary | ICD-10-CM | POA: Diagnosis not present

## 2022-07-31 DIAGNOSIS — K5790 Diverticulosis of intestine, part unspecified, without perforation or abscess without bleeding: Secondary | ICD-10-CM | POA: Diagnosis not present

## 2022-07-31 DIAGNOSIS — K56609 Unspecified intestinal obstruction, unspecified as to partial versus complete obstruction: Secondary | ICD-10-CM | POA: Diagnosis not present

## 2022-08-01 DIAGNOSIS — K56609 Unspecified intestinal obstruction, unspecified as to partial versus complete obstruction: Secondary | ICD-10-CM | POA: Diagnosis not present

## 2022-08-07 DIAGNOSIS — Z419 Encounter for procedure for purposes other than remedying health state, unspecified: Secondary | ICD-10-CM | POA: Diagnosis not present

## 2022-08-28 DIAGNOSIS — F331 Major depressive disorder, recurrent, moderate: Secondary | ICD-10-CM | POA: Diagnosis not present
